# Patient Record
Sex: Female | Born: 1935 | Race: Black or African American | Hispanic: No | Smoking: Never smoker
Health system: Southern US, Community
[De-identification: ages and names within clinical notes are randomized; demographics above are authoritative.]

## PROBLEM LIST (undated history)

## (undated) DIAGNOSIS — G20 Parkinson's disease: Secondary | ICD-10-CM

## (undated) DIAGNOSIS — E039 Hypothyroidism, unspecified: Secondary | ICD-10-CM

## (undated) DIAGNOSIS — G20A1 Parkinson's disease without dyskinesia, without mention of fluctuations: Secondary | ICD-10-CM

## (undated) DIAGNOSIS — F0390 Unspecified dementia without behavioral disturbance: Secondary | ICD-10-CM

---

## 2011-04-10 ENCOUNTER — Emergency Department: Payer: Self-pay | Admitting: Internal Medicine

## 2012-09-06 ENCOUNTER — Other Ambulatory Visit: Payer: Self-pay

## 2012-09-07 LAB — URINALYSIS, COMPLETE
Bilirubin,UR: NEGATIVE
Blood: NEGATIVE
Glucose,UR: NEGATIVE mg/dL (ref 0–75)
Hyaline Cast: 1
Ph: 5 (ref 4.5–8.0)
Protein: 30
RBC,UR: 7 /HPF (ref 0–5)
Specific Gravity: 1.034 (ref 1.003–1.030)
Squamous Epithelial: <1
WBC UR: 180 /HPF (ref 0–5)

## 2012-10-03 ENCOUNTER — Ambulatory Visit: Payer: Self-pay | Admitting: Family

## 2012-10-17 ENCOUNTER — Emergency Department: Payer: Self-pay | Admitting: Emergency Medicine

## 2012-10-17 LAB — CBC
HGB: 9 g/dL — ABNORMAL LOW (ref 12.0–16.0)
MCH: 25 pg — ABNORMAL LOW (ref 26.0–34.0)
MCHC: 31.1 g/dL — ABNORMAL LOW (ref 32.0–36.0)
MCV: 80 fL (ref 80–100)
Platelet: 243 10*3/uL (ref 150–440)
RBC: 3.61 10*6/uL — ABNORMAL LOW (ref 3.80–5.20)
RDW: 16.2 % — ABNORMAL HIGH (ref 11.5–14.5)

## 2012-10-17 LAB — CK TOTAL AND CKMB (NOT AT ARMC): CK, Total: 121 U/L (ref 21–215)

## 2012-10-17 LAB — COMPREHENSIVE METABOLIC PANEL
Albumin: 3 g/dL — ABNORMAL LOW (ref 3.4–5.0)
Alkaline Phosphatase: 84 U/L (ref 50–136)
Anion Gap: 7 (ref 7–16)
Calcium, Total: 8.7 mg/dL (ref 8.5–10.1)
Co2: 28 mmol/L (ref 21–32)
EGFR (African American): 37 — ABNORMAL LOW
EGFR (Non-African Amer.): 32 — ABNORMAL LOW
Glucose: 116 mg/dL — ABNORMAL HIGH (ref 65–99)
Osmolality: 282 (ref 275–301)
Potassium: 4.2 mmol/L (ref 3.5–5.1)
SGOT(AST): 16 U/L (ref 15–37)
Total Protein: 7.5 g/dL (ref 6.4–8.2)

## 2012-10-17 LAB — URINALYSIS, COMPLETE
Bilirubin,UR: NEGATIVE
Nitrite: NEGATIVE
Ph: 8 (ref 4.5–8.0)
Protein: 30
RBC,UR: 2 /HPF (ref 0–5)
Squamous Epithelial: <1
WBC UR: 53 /HPF (ref 0–5)

## 2012-10-17 LAB — PROTIME-INR: Prothrombin Time: 13.8 secs (ref 11.5–14.7)

## 2012-10-17 LAB — TROPONIN I: Troponin-I: <0.02 ng/mL

## 2012-12-12 ENCOUNTER — Ambulatory Visit: Payer: Self-pay | Admitting: Family

## 2014-04-03 ENCOUNTER — Ambulatory Visit: Payer: Self-pay | Admitting: Family Medicine

## 2014-04-03 LAB — URINALYSIS, COMPLETE
BILIRUBIN, UR: NEGATIVE
GLUCOSE, UR: NEGATIVE mg/dL (ref 0–75)
Ketone: NEGATIVE
Nitrite: POSITIVE
Ph: 5 (ref 4.5–8.0)
Protein: NEGATIVE
RBC,UR: 2 /HPF (ref 0–5)
Specific Gravity: 1.017 (ref 1.003–1.030)
WBC UR: 60 /HPF (ref 0–5)

## 2014-04-05 LAB — URINE CULTURE

## 2018-06-29 ENCOUNTER — Other Ambulatory Visit: Payer: Self-pay

## 2018-06-29 ENCOUNTER — Inpatient Hospital Stay: Payer: Medicare (Managed Care)

## 2018-06-29 ENCOUNTER — Encounter: Payer: Self-pay | Admitting: Internal Medicine

## 2018-06-29 ENCOUNTER — Inpatient Hospital Stay
Admission: EM | Admit: 2018-06-29 | Discharge: 2018-07-03 | DRG: 377 | Disposition: A | Discharge status: Skilled Nursing Facility | Payer: Medicare (Managed Care) | Source: Skilled Nursing Facility | Attending: Internal Medicine | Admitting: Internal Medicine

## 2018-06-29 DIAGNOSIS — D696 Thrombocytopenia, unspecified: Secondary | ICD-10-CM | POA: Diagnosis present

## 2018-06-29 DIAGNOSIS — Z7902 Long term (current) use of antithrombotics/antiplatelets: Secondary | ICD-10-CM

## 2018-06-29 DIAGNOSIS — G20 Parkinson's disease: Secondary | ICD-10-CM | POA: Diagnosis present

## 2018-06-29 DIAGNOSIS — R4182 Altered mental status, unspecified: Secondary | ICD-10-CM | POA: Diagnosis not present

## 2018-06-29 DIAGNOSIS — R571 Hypovolemic shock: Secondary | ICD-10-CM | POA: Diagnosis present

## 2018-06-29 DIAGNOSIS — Z7401 Bed confinement status: Secondary | ICD-10-CM | POA: Diagnosis not present

## 2018-06-29 DIAGNOSIS — D62 Acute posthemorrhagic anemia: Secondary | ICD-10-CM | POA: Diagnosis present

## 2018-06-29 DIAGNOSIS — Z794 Long term (current) use of insulin: Secondary | ICD-10-CM

## 2018-06-29 DIAGNOSIS — E876 Hypokalemia: Secondary | ICD-10-CM | POA: Diagnosis present

## 2018-06-29 DIAGNOSIS — K625 Hemorrhage of anus and rectum: Secondary | ICD-10-CM

## 2018-06-29 DIAGNOSIS — Z79899 Other long term (current) drug therapy: Secondary | ICD-10-CM

## 2018-06-29 DIAGNOSIS — N281 Cyst of kidney, acquired: Secondary | ICD-10-CM | POA: Diagnosis present

## 2018-06-29 DIAGNOSIS — Z7982 Long term (current) use of aspirin: Secondary | ICD-10-CM | POA: Diagnosis not present

## 2018-06-29 DIAGNOSIS — I9589 Other hypotension: Secondary | ICD-10-CM | POA: Diagnosis not present

## 2018-06-29 DIAGNOSIS — F0280 Dementia in other diseases classified elsewhere without behavioral disturbance: Secondary | ICD-10-CM | POA: Diagnosis present

## 2018-06-29 DIAGNOSIS — K921 Melena: Principal | ICD-10-CM | POA: Diagnosis present

## 2018-06-29 DIAGNOSIS — I959 Hypotension, unspecified: Secondary | ICD-10-CM

## 2018-06-29 DIAGNOSIS — K922 Gastrointestinal hemorrhage, unspecified: Secondary | ICD-10-CM | POA: Diagnosis not present

## 2018-06-29 DIAGNOSIS — E039 Hypothyroidism, unspecified: Secondary | ICD-10-CM | POA: Diagnosis present

## 2018-06-29 HISTORY — DX: Parkinson's disease without dyskinesia, without mention of fluctuations: G20.A1

## 2018-06-29 HISTORY — DX: Unspecified dementia, unspecified severity, without behavioral disturbance, psychotic disturbance, mood disturbance, and anxiety: F03.90

## 2018-06-29 HISTORY — DX: Hypothyroidism, unspecified: E03.9

## 2018-06-29 HISTORY — DX: Parkinson's disease: G20

## 2018-06-29 LAB — CBC WITH DIFFERENTIAL/PLATELET
ABS IMMATURE GRANULOCYTES: 0.01 10*3/uL (ref 0.00–0.07)
Basophils Absolute: 0 10*3/uL (ref 0.0–0.1)
Basophils Relative: 0 %
Eosinophils Absolute: 0.1 10*3/uL (ref 0.0–0.5)
Eosinophils Relative: 2 %
HCT: 23.6 % — ABNORMAL LOW (ref 36.0–46.0)
Hemoglobin: 7 g/dL — ABNORMAL LOW (ref 12.0–15.0)
Immature Granulocytes: 0 %
Lymphocytes Relative: 22 %
Lymphs Abs: 1.3 10*3/uL (ref 0.7–4.0)
MCH: 26.9 pg (ref 26.0–34.0)
MCHC: 29.7 g/dL — ABNORMAL LOW (ref 30.0–36.0)
MCV: 90.8 fL (ref 80.0–100.0)
Monocytes Absolute: 0.4 10*3/uL (ref 0.1–1.0)
Monocytes Relative: 6 %
NRBC: 0 % (ref 0.0–0.2)
Neutro Abs: 4.2 10*3/uL (ref 1.7–7.7)
Neutrophils Relative %: 70 %
Platelets: 149 10*3/uL — ABNORMAL LOW (ref 150–400)
RBC: 2.6 MIL/uL — ABNORMAL LOW (ref 3.87–5.11)
RDW: 14.6 % (ref 11.5–15.5)
WBC: 6.1 10*3/uL (ref 4.0–10.5)

## 2018-06-29 LAB — COMPREHENSIVE METABOLIC PANEL
ALT: <5 U/L (ref 0–44)
AST: 17 U/L (ref 15–41)
Albumin: 2.6 g/dL — ABNORMAL LOW (ref 3.5–5.0)
Alkaline Phosphatase: 34 U/L — ABNORMAL LOW (ref 38–126)
Anion gap: 5 (ref 5–15)
BUN: 36 mg/dL — ABNORMAL HIGH (ref 8–23)
CHLORIDE: 112 mmol/L — AB (ref 98–111)
CO2: 23 mmol/L (ref 22–32)
Calcium: 8.2 mg/dL — ABNORMAL LOW (ref 8.9–10.3)
Creatinine, Ser: 0.67 mg/dL (ref 0.44–1.00)
GFR calc non Af Amer: >60 mL/min (ref >60)
Glucose, Bld: 120 mg/dL — ABNORMAL HIGH (ref 70–99)
Potassium: 5.1 mmol/L (ref 3.5–5.1)
SODIUM: 140 mmol/L (ref 135–145)
Total Bilirubin: 0.6 mg/dL (ref 0.3–1.2)
Total Protein: 5.2 g/dL — ABNORMAL LOW (ref 6.5–8.1)

## 2018-06-29 LAB — PROTIME-INR
INR: 1.34
PROTHROMBIN TIME: 16.4 s — AB (ref 11.4–15.2)

## 2018-06-29 LAB — GLUCOSE, CAPILLARY: Glucose-Capillary: 103 mg/dL — ABNORMAL HIGH (ref 70–99)

## 2018-06-29 LAB — TECHNOLOGIST SMEAR REVIEW

## 2018-06-29 LAB — PLATELET COUNT: Platelets: 114 10*3/uL — ABNORMAL LOW (ref 150–400)

## 2018-06-29 LAB — HEMOGLOBIN AND HEMATOCRIT, BLOOD
HCT: 32.2 % — ABNORMAL LOW (ref 36.0–46.0)
Hemoglobin: 10.3 g/dL — ABNORMAL LOW (ref 12.0–15.0)

## 2018-06-29 LAB — ABO/RH: ABO/RH(D): B POS

## 2018-06-29 LAB — APTT: aPTT: 29 seconds (ref 24–36)

## 2018-06-29 LAB — PREPARE RBC (CROSSMATCH)

## 2018-06-29 LAB — MRSA PCR SCREENING: MRSA by PCR: NEGATIVE

## 2018-06-29 LAB — FIBRINOGEN: Fibrinogen: 212 mg/dL (ref 210–475)

## 2018-06-29 LAB — CG4 I-STAT (LACTIC ACID): Lactic Acid, Venous: 2.36 mmol/L (ref 0.5–1.9)

## 2018-06-29 LAB — FIBRIN DERIVATIVES D-DIMER (ARMC ONLY): Fibrin derivatives D-dimer (ARMC): 3415.25 ng/mL (FEU) — ABNORMAL HIGH (ref 0.00–499.00)

## 2018-06-29 LAB — MASSIVE TRANSFUSION PROTOCOL ORDER (BLOOD BANK NOTIFICATION)

## 2018-06-29 MED ORDER — TRANEXAMIC ACID-NACL 1000-0.7 MG/100ML-% IV SOLN
1000.0000 mg | Freq: Once | INTRAVENOUS | Status: AC
  Administered 2018-06-29: 1000 mg via INTRAVENOUS
  Filled 2018-06-29: qty 100

## 2018-06-29 MED ORDER — PANTOPRAZOLE SODIUM 40 MG IV SOLR
40.0000 mg | Freq: Two times a day (BID) | INTRAVENOUS | Status: DC
  Administered 2018-07-03: 40 mg via INTRAVENOUS
  Filled 2018-06-29: qty 40

## 2018-06-29 MED ORDER — TECHNETIUM TC 99M-LABELED RED BLOOD CELLS IV KIT
23.7700 | PACK | Freq: Once | INTRAVENOUS | Status: AC | PRN
  Administered 2018-06-29: 23.77 via INTRAVENOUS

## 2018-06-29 MED ORDER — PANTOPRAZOLE SODIUM 40 MG IV SOLR
40.0000 mg | Freq: Once | INTRAVENOUS | Status: AC
  Administered 2018-06-29: 40 mg via INTRAVENOUS
  Filled 2018-06-29: qty 40

## 2018-06-29 MED ORDER — SODIUM CHLORIDE 0.9 % IV SOLN
8.0000 mg/h | INTRAVENOUS | Status: AC
  Administered 2018-06-29 – 2018-07-01 (×5): 8 mg/h via INTRAVENOUS
  Filled 2018-06-29 (×6): qty 80

## 2018-06-29 MED ORDER — SODIUM CHLORIDE 0.9 % IV BOLUS: 500.0000 mL | Freq: Once | INTRAVENOUS | Status: DC

## 2018-06-29 MED ORDER — ACETAMINOPHEN 325 MG PO TABS
650.0000 mg | ORAL_TABLET | Freq: Four times a day (QID) | ORAL | Status: DC | PRN
  Administered 2018-07-02: 650 mg via ORAL
  Filled 2018-06-29: qty 2

## 2018-06-29 MED ORDER — ONDANSETRON HCL 4 MG/2ML IJ SOLN: 4.0000 mg | Freq: Four times a day (QID) | INTRAMUSCULAR | Status: DC | PRN

## 2018-06-29 MED ORDER — SODIUM CHLORIDE 0.9% FLUSH
3.0000 mL | Freq: Two times a day (BID) | INTRAVENOUS | Status: DC
Start: 2018-06-29 — End: 2018-07-03
  Administered 2018-06-29 – 2018-07-03 (×8): 3 mL via INTRAVENOUS

## 2018-06-29 MED ORDER — SODIUM CHLORIDE 0.9% IV SOLUTION
Freq: Once | INTRAVENOUS | Status: DC
  Filled 2018-06-29: qty 250

## 2018-06-29 MED ORDER — ALBUTEROL SULFATE (2.5 MG/3ML) 0.083% IN NEBU: 2.5000 mg | INHALATION_SOLUTION | RESPIRATORY_TRACT | Status: DC | PRN

## 2018-06-29 MED ORDER — SODIUM CHLORIDE 0.9 % IV SOLN
80.0000 mg | Freq: Once | INTRAVENOUS | Status: AC
  Administered 2018-06-29: 80 mg via INTRAVENOUS
  Filled 2018-06-29: qty 80

## 2018-06-29 MED ORDER — NOREPINEPHRINE 16 MG/250ML-% IV SOLN
0.0000 ug/min | INTRAVENOUS | Status: DC
  Filled 2018-06-29: qty 250

## 2018-06-29 MED ORDER — ONDANSETRON HCL 4 MG PO TABS: 4.0000 mg | ORAL_TABLET | Freq: Four times a day (QID) | ORAL | Status: DC | PRN

## 2018-06-29 MED ORDER — ACETAMINOPHEN 650 MG RE SUPP: 650.0000 mg | Freq: Four times a day (QID) | RECTAL | Status: DC | PRN

## 2018-06-29 MED ORDER — SODIUM CHLORIDE 0.9 % IV SOLN
1000.0000 mL | Freq: Once | INTRAVENOUS | Status: AC
  Administered 2018-06-29: 1000 mL via INTRAVENOUS

## 2018-06-29 NOTE — H&P (Signed)
[facility] NAME: Frances Gardner    MR#:  [id number]  DATE OF BIRTH:  1926-06-27  DATE OF ADMISSION:  06/29/2018  PRIMARY CARE PHYSICIAN: Gareth Morgan, MD   REQUESTING/REFERRING PHYSICIAN: Dr. Archie Balboa  CHIEF COMPLAINT:   Chief Complaint  Patient presents with  . Altered Mental Status    HISTORY OF PRESENT ILLNESS:  Frances Gardner  is a 85 y.o. female with a known history of Parkinson's disease, hypothyroidism presents from local nursing home with severe rectal bleeding.  Patient was found to be hypotensive with systolic blood pressure in the 70s.  Massive blood transfusion protocol initiated and she received 4 units of packed RBC and is about to receive 1 unit of FFP and presently blood pressure has improved to 92/62.  Patient has a most form with full CODE STATUS.  Unable to contact any family.  Not on any anticoagulants. Patient is being admitted for severe GI bleed thought to be lower.  Bleeding scan pending. Patient is drowsy and confused and unable to give any history. Continues to have rectal bleeding at this time.  Blood on the patient's bed is being suctioned and has almost filled suction canister.  PAST MEDICAL HISTORY:   Past Medical History:  Diagnosis Date  . Dementia ([place])   . Hypothyroidism   . Parkinson's disease ([place])     PAST SURGICAL HISTORY:  none  SOCIAL HISTORY:   Social History   Tobacco Use  . Smoking status: Not on file  Substance Use Topics  . Alcohol use: Not on file    FAMILY HISTORY:  No family history on file. Unobtainable DRUG ALLERGIES:   Allergies  Allergen Reactions  . Penicillins   . Shellfish-Derived Products     REVIEW OF SYSTEMS:   Review of Systems  Unable to perform ROS: Mental status change    MEDICATIONS AT HOME:   Prior to Admission medications   Medication Sig Start Date End Date Taking? Authorizing Provider  acetaminophen (TYLENOL) 325 MG tablet Take 650 mg  by mouth every 6 (six) hours as needed.   Yes [provider]  acetaminophen (TYLENOL) 650 MG CR tablet Take 650 mg by mouth every 8 (eight) hours as needed for pain.   Yes [provider]  carbidopa-levodopa (SINEMET IR) 25-100 MG tablet Take 2 tablets by mouth 3 (three) times daily. 10/17/11  Yes [provider]  cholecalciferol (VITAMIN D) 25 MCG (1000 UT) tablet Take 1,000 Units by mouth daily.   Yes [provider]  guaiFENesin-dextromethorphan (ROBITUSSIN DM) 100-10 MG/5ML syrup Take 5 mLs by mouth every 4 (four) hours as needed for cough.   Yes [provider]  latanoprost (XALATAN) 0.005 % ophthalmic solution Place 1 drop into both eyes daily at 6 (six) AM. 05/30/11  Yes [provider]  mineral oil-hydrophilic petrolatum (AQUAPHOR) ointment Apply 1 application topically Nightly.   Yes [provider]  naloxone (NARCAN) nasal spray 4 mg/0.1 mL Place 1 spray into the nose.   Yes [provider]  senna (SENOKOT) 8.6 MG TABS tablet Take 8.6 mg by mouth 2 (two) times daily as needed for mild constipation.   Yes [provider]     VITAL SIGNS:  Blood pressure 104/90, pulse 84, temperature 97.7 F (36.5 C), temperature source Oral, resp. rate (!) 21, height 5\' 7"  (1.702 m), weight 95.3 kg, SpO2 100 %.  PHYSICAL EXAMINATION:  Physical Exam  GENERAL:  85 y.o.-year-old patient  lying in the bed . HEENT: Head atraumatic, normocephalic.  NECK:  LUNGS: CTA CARDIOVASCULAR: S1, S2 normal. No murmurs, rubs, or gallops.  ABDOMEN: Soft, nontender, nondistended. Bowel sounds present. EXTREMITIES : Edema  NEUROLOGIC: Not following instructions PSYCHIATRIC: The patient is drowzy SKIN: No obvious rash, lesion, or ulcer.   LABORATORY PANEL:   CBC Recent Labs  Lab 06/29/18 1450  WBC 6.1  HGB 7.0*  HCT 23.6*  PLT 149*    ------------------------------------------------------------------------------------------------------------------  Chemistries  Recent Labs  Lab 06/29/18 1450  NA 140  K 5.1  CL 112*  CO2 23  GLUCOSE 120*  BUN 36*  CREATININE 0.67  CALCIUM 8.2*  AST 17  ALT <5  ALKPHOS 34*  BILITOT 0.6   ------------------------------------------------------------------------------------------------------------------  Cardiac Enzymes No results for input(s): TROPONINI in the last 168 hours. ------------------------------------------------------------------------------------------------------------------  RADIOLOGY:  No results found.   IMPRESSION AND PLAN:   *GI bleed with acute blood loss anemia.  Continues to have severe bleeding. Status post 4 units packed RBC transfusion.  1 FFP pending.  At this time blood pressure is systolic 703.  Will get a stat bleeding scan.  IR has been contacted and are aware of the case.  IR needs to be called if bleeding scan positive.  No vomiting. We will bolus normal saline stat Calls have already been made to GI/surgery/vascular surgery. Patient is critically ill.  High risk for deterioration and death. Patient will be admitted to ICU.  Central access is being placed at this time.  Discussed with Dr. Jefferson Fuel in ICU.  I tried calling the contact numbers on her nursing home paperwork.  1 of the numbers is for [employer] agencies and the other number is out of service.  All the records are reviewed and case discussed with ED provider. Management plans discussed with the patient, family and they are in agreement.  CODE STATUS: FULL CODE  TOTAL TIME TAKING CARE OF THIS PATIENT: 80 minutes.   Leia Alf Maebel Marasco M.D on 06/29/2018 at 5:13 PM  Between 7am to 6pm - Pager - [phone]  After 6pm go to [email or web address] - password [email or web address]  Office  [phone]  CC: Primary care physician; Gareth Morgan, MD  Note:  This dictation was prepared with Dragon dictation along with smaller phrase technology. Any transcriptional errors that result from this process are unintentional.

## 2018-06-29 NOTE — ED Triage Notes (Signed)
Pt to ER via EMS from [facility] with reports of diarrhea and altered mental status. Pt is normally interactive, on arrival she is alert, but does not respond to to questions or commands.

## 2018-06-29 NOTE — Progress Notes (Signed)
Patient is confused and pulling at central line, hand mitts already in place. Telesitter order entered for patient safety.

## 2018-06-29 NOTE — Consult Note (Addendum)
PULMONARY / CRITICAL CARE MEDICINE  Name: Frances Gardner MRN: [id number] DOB: 1926-09-12    LOS: 1  Referring Provider: Dr. Darvin Neighbours Reason for Referral: Acute GI bleed Brief patient description: 85 year old nursing home resident who presented to the ED following several episodes of bright red blood per rectum.  Had a positive bleeding scan and received 4 units of packed red blood cells and 1 units of fresh frozen plasma.  Status post angiogram without any identified source of bleeding.  HPI: This is an 85 year old female with a medical history as indicated below who presented to the ED from [facility] with several episodes of rectal bleeding.  At the ED, patient continued to have bloody stools.  She was given 4 units of packed red blood cells and 1 unit of FFP.  Her blood pressure was low with systolics in the 23R.  Blood pressure improved with transfusion.  She was taken for a bleeding scan that was positive.  IR was consulted and she was taken for visceral angiogram.  No source of bleeding identified.  She is now status post angiogram and her blood pressure remained stable.  Repeat labs show a hemoglobin of 9.6 and a hematocrit of 29.8.  Her platelets are 105 and her WBC is 12.0.  PT/INR 16.4/1.3.  She remains confused but is able to follow basic commands.  She denies abdominal pain, nausea and vomiting.  No further rectal bleeding noted.  Postprocedure patient patient passed out small amount of dark-colored blood. Of note, patient is not on any anticoagulants. She is currently on a Protonix infusion  Past Medical History:  Diagnosis Date  . Dementia ([place])   . Hypothyroidism   . Parkinson's disease ([place])    History reviewed. No pertinent surgical history. Prior to Admission medications   Medication Sig Start Date End Date Taking? Authorizing Provider  amLODipine (NORVASC) 5 MG tablet Take 5 mg by mouth daily.   Yes [provider]  clopidogrel (PLAVIX) 75 MG tablet Take  75 mg by mouth daily.   Yes [provider]  donepezil (ARICEPT) 5 MG tablet Take 1 tablet (5 mg total) by mouth at bedtime. 02/17/18 03/29/18 Yes Sowles, Drue Stager, MD  empagliflozin (JARDIANCE) 25 MG TABS tablet Take 25 mg by mouth daily.   Yes [provider]  glycopyrrolate (ROBINUL) 1 MG tablet Take 1 mg by mouth 2 (two) times daily.   Yes [provider]  insulin aspart (NOVOLOG FLEXPEN) 100 UNIT/ML FlexPen Inject 12 Units into the skin 2 (two) times daily.   Yes [provider]  insulin aspart (NOVOLOG) 100 UNIT/ML FlexPen Inject 18 Units into the skin daily. At 1700   Yes [provider]  Insulin Degludec-Liraglutide (XULTOPHY) 100-3.6 UNIT-MG/ML SOPN Inject 50 Units into the skin daily.   Yes [provider]  levETIRAcetam (KEPPRA) 500 MG tablet Take 500 mg by mouth 2 (two) times daily.   Yes [provider]  lipase/protease/amylase (CREON) 12000 units CPEP capsule Take 6,000 Units by mouth 3 (three) times daily before meals.   Yes [provider]  lipase/protease/amylase (CREON) 12000 units CPEP capsule Take 3,000 Units by mouth at bedtime. With snack   Yes [provider]  lisinopril (PRINIVIL,ZESTRIL) 5 MG tablet Take 5 mg by mouth daily.   Yes [provider]  metoprolol succinate (TOPROL-XL) 25 MG 24 hr tablet Take 1 tablet (25 mg total) by mouth daily. 02/17/18  Yes Sowles, Drue Stager, MD  rosuvastatin (CRESTOR) 40 MG tablet Take 1  tablet (40 mg total) by mouth daily. 02/17/18 03/29/18 Yes Steele Sizer, MD  aspirin EC 81 MG tablet Take 81 mg by mouth daily.    [provider]  famotidine (PEPCID) 20 MG tablet Take 1 tablet (20 mg total) by mouth 2 (two) times daily. 02/17/18 03/19/18  Steele Sizer, MD  gabapentin (NEURONTIN) 300 MG capsule Take 1 capsule (300 mg total) by mouth 2 (two) times daily. 02/17/18 03/19/18  Steele Sizer, MD  insulin glargine (LANTUS) 100 UNIT/ML injection Inject 0.1  mLs (10 Units total) into the skin daily. 02/17/18 03/19/18  Steele Sizer, MD  lacosamide 100 MG TABS Take 1 tablet (100 mg total) by mouth 2 (two) times daily. Patient not taking: Reported on 03/29/2018 07/05/17   Fritzi Mandes, MD  promethazine (PHENERGAN) 12.5 MG tablet Take 1 tablet (12.5 mg total) by mouth every 6 (six) hours as needed for nausea or vomiting. Patient not taking: Reported on 03/29/2018 04/23/17   Stark Klein, MD  sertraline (ZOLOFT) 25 MG tablet Take 1 tablet (25 mg total) by mouth daily. Patient not taking: Reported on 03/29/2018 02/17/18   Steele Sizer, MD   Allergies Allergies  Allergen Reactions  . Penicillins   . Shellfish-Derived Products     Family History History reviewed. No pertinent family history. Social History  has an unknown smoking status. She has never used smokeless tobacco. No history on file for alcohol and drug.  Review Of Systems:   Constitutional: Negative for fever and chills.  HENT: Negative for congestion and rhinorrhea.  Eyes: Negative for redness and visual disturbance.  Respiratory: Negative for shortness of breath and wheezing.  Cardiovascular: Negative for chest pain and palpitations.  Gastrointestinal: Positive for bloody stools Genitourinary: Negative for dysuria and urgency.  Endocrine: Denies polyuria, polyphagia and heat intolerance Musculoskeletal: Negative for myalgias and arthralgias.  Skin: Negative for pallor and wound.  Neurological: Negative for dizziness and headaches   VITAL SIGNS: BP 107/61   Pulse 88   Temp 98.4 F (36.9 C) (Oral)   Resp 20   Ht 5\' 8"  (1.727 m)   Wt 77.4 kg   SpO2 100%   BMI 25.95 kg/m   HEMODYNAMICS:    VENTILATOR SETTINGS:    INTAKE / OUTPUT: I/O last 3 completed shifts: In: 655.9 [I.V.:500; IV Piggyback:155.9] Out: 700 [Stool:700]  PHYSICAL EXAMINATION: General: Appears chronically ill HEENT: PERRLA, trachea midline, no JVD Neuro: Awake, alert, confused, follows basic  commands, speech is normal Cardiovascular: Apical pulse regular, S1-S2, no murmur regurg or gallop, +2 pulses bilaterally, trace edema Lungs: Clear to auscultation bilaterally, diminished in the bases Abdomen: Nondistended, normal bowel sounds in all 4 quadrants, palpation reveals no organomegaly Musculoskeletal: No joint deformities or swelling Skin: Pale, warm and dry  LABS:  BMET Recent Labs  Lab 06/29/18 1450  NA 140  K 5.1  CL 112*  CO2 23  BUN 36*  CREATININE 0.67  GLUCOSE 120*    Electrolytes Recent Labs  Lab 06/29/18 1450 06/29/18 2344  CALCIUM 8.2*  --   MG  --  1.9  PHOS  --  3.9    CBC Recent Labs  Lab 06/29/18 1450 06/29/18 1634 06/29/18 1847 06/29/18 2344  WBC 6.1  --   --  12.0*  HGB 7.0*  --  10.3* 9.6*  HCT 23.6*  --  32.2* 29.8*  PLT 149* 114*  --  105*    Coag's Recent Labs  Lab 06/29/18 1634 06/29/18 2344  APTT 29  --  INR 1.34 1.34    Sepsis Markers Recent Labs  Lab 06/29/18 1507  LATICACIDVEN 2.36*    ABG No results for input(s): PHART, PCO2ART, PO2ART in the last 168 hours.  Liver Enzymes Recent Labs  Lab 06/29/18 1450  AST 17  ALT <5  ALKPHOS 34*  BILITOT 0.6  ALBUMIN 2.6*    Cardiac Enzymes No results for input(s): TROPONINI, PROBNP in the last 168 hours.  Glucose Recent Labs  Lab 06/29/18 1846  GLUCAP 103*    Imaging Nm Gi Blood Loss  Result Date: 06/29/2018 CLINICAL DATA:  GI bleed beginning yesterday EXAM: NUCLEAR MEDICINE GASTROINTESTINAL BLEEDING SCAN TECHNIQUE: Sequential abdominal images were obtained following intravenous administration of Tc-23m labeled red blood cells. RADIOPHARMACEUTICALS:  23.77 mCi Tc-16m pertechnetate in-vitro labeled red cells. COMPARISON:  None FINDINGS: Normal blood pool distribution of tracer. Beginning at 15 minutes, abnormal gastrointestinal localization of tracer is identified beginning in the far LEFT upper quadrant. Tracer than progresses caudally in the lateral LEFT  mid abdomen. Findings are most consistent with acute gastrointestinal bleeding at the region of the splenic flexure of the colon. IMPRESSION: Positive GI bleeding scan for presence of active gastrointestinal bleeding at the splenic flexure of the colon. Electronically Signed   By: Lavonia Dana M.D.   On: 06/29/2018 22:45   Dg Chest Portable 1 View  Result Date: 06/29/2018 CLINICAL DATA:  Central line placement. EXAM: PORTABLE CHEST 1 VIEW COMPARISON:  None. FINDINGS: Central venous catheter via LEFT internal jugular venous approach with distal tip projecting in proximal superior vena cava. Patient rotated to the RIGHT. Mild cardiomegaly. No pleural effusion or focal consolidation. No pneumothorax. RIGHT mid lung zone granuloma versus projectional artifact. Skin fold projected chest. Surgical clips in the included right abdomen compatible with cholecystectomy. IMPRESSION: 1. LEFT internal jugular central venous catheter distal tip projects in proximal superior vena cava. No pneumothorax. 2. Mild cardiomegaly, no acute pulmonary process. Electronically Signed   By: Elon Alas M.D.   On: 06/29/2018 18:11    STUDIES:  None  CULTURES: None  ANTIBIOTICS: None  SIGNIFICANT EVENTS: 06/29/2017: Admitted  LINES/TUBES: PIV's   ASSESSMENT  Acute lower GI bleed-status post positive bleeding scan but negative angiogram with no identified source to embolize Acute blood loss anemia Hypovolemic shock Thrombocytopenia  PLAN Hemodynamic monitoring per ICU protocol Treatment plan per GI and IR recommendations Trend hemoglobin and hematocrit and transfuse for hemoglobin less than 7 Keep n.p.o. and initiate clear liquids if no further bleeding Monitor for worsening thrombocytopenia IV fluids and pressors to maintain mean arterial blood pressure greater than 65 Continue Protonix infusion  Best Practice: Code Status: Full code.  Patient has a court assigned guardian Diet: N.p.o. and start clear  liquids in the morning if no further bleeding GI prophylaxis: Already on a Protonix infusion VTE prophylaxis: Pharmacologic VTE prophylaxis contraindicated due to bleeding  FAMILY  - Updates: Multiple calls placed to multiple family members and and guardian. Grandson called back as well as a representative from social services department.  Both updated on patient's status.  Tiaja Hagan S[facility] ANP-BC Pulmonary and Critical Care Medicine [facility] Pager [phone] or [phone]  NB: This document was prepared using Dragon voice recognition software and may include unintentional dictation errors.    06/30/2018, 4:36 AM

## 2018-06-29 NOTE — Consult Note (Addendum)
Chief Complaint: Patient was seen in consultation today for GI bleed  Referring Physician(s): Jodene Nam, NP  Patient Status: [place] - In-pt  History of Present Illness: Frances Gardner is a 85 y.o. female presenting with profuse lower GI bleed today and has now required 4U RBC and 1U FFP transfusion. NM bleeding scan recently completed tonight is positive for bleeding in region of splenic flexure of colon with transit in the colon.  Hgb did respond to transfusion, but still actively bleeding, including bloody BM during bleeding scan. Soft BP's despite blood products and IV fluids. Currently not on pressors or intubated. Patient is a full code.  Patient resident of [facility] and demented. Unable to contact family. In process of contacting DSS and Adult Protective Services. ICU team willing to give emergent consent given urgency of procedure.  Past Medical History:  Diagnosis Date  . Dementia ([place])   . Hypothyroidism   . Parkinson's disease ([place])      Allergies: Penicillins and Shellfish-derived products  Medications: Prior to Admission medications   Medication Sig Start Date End Date Taking? Authorizing Provider  acetaminophen (TYLENOL) 325 MG tablet Take 650 mg by mouth every 6 (six) hours as needed.   Yes [provider]  acetaminophen (TYLENOL) 650 MG CR tablet Take 650 mg by mouth every 8 (eight) hours as needed for pain.   Yes [provider]  carbidopa-levodopa (SINEMET IR) 25-100 MG tablet Take 2 tablets by mouth 3 (three) times daily. 10/17/11  Yes [provider]  cholecalciferol (VITAMIN D) 25 MCG (1000 UT) tablet Take 1,000 Units by mouth daily.   Yes [provider]  guaiFENesin-dextromethorphan (ROBITUSSIN DM) 100-10 MG/5ML syrup Take 5 mLs by mouth every 4 (four) hours as needed for cough.   Yes [provider]  latanoprost (XALATAN) 0.005 % ophthalmic solution Place 1 drop into both eyes daily at 6 (six) AM. 05/30/11   Yes [provider]  mineral oil-hydrophilic petrolatum (AQUAPHOR) ointment Apply 1 application topically Nightly.   Yes [provider]  naloxone (NARCAN) nasal spray 4 mg/0.1 mL Place 1 spray into the nose.   Yes [provider]  senna (SENOKOT) 8.6 MG TABS tablet Take 8.6 mg by mouth 2 (two) times daily as needed for mild constipation.   Yes [provider]     No family history on file.  Social History   Socioeconomic History  . Marital status: Unknown    Spouse name: Not on file  . Number of children: Not on file  . Years of education: Not on file  . Highest education level: Not on file  Occupational History  . Not on file  Social Needs  . Financial resource strain: Not on file  . Food insecurity:    Worry: Not on file    Inability: Not on file  . Transportation needs:    Medical: Not on file    Non-medical: Not on file  Tobacco Use  . Smoking status: Not on file  Substance and Sexual Activity  . Alcohol use: Not on file  . Drug use: Not on file  . Sexual activity: Not on file  Lifestyle  . Physical activity:    Days per week: Not on file    Minutes per session: Not on file  . Stress: Not on file  Relationships  . Social connections:    Talks on phone: Not on file    Gets together: Not on file    Attends religious service:  Not on file    Active member of club or organization: Not on file    Attends meetings of clubs or organizations: Not on file    Relationship status: Not on file  Other Topics Concern  . Not on file  Social History Narrative  . Not on file     Review of Systems: A 12 point ROS discussed and pertinent positives are indicated in the HPI above.  All other systems are negative.  Review of Systems  Respiratory: Negative.   Gastrointestinal: Positive for blood in stool. Negative for abdominal distention, abdominal pain, nausea and vomiting.  Genitourinary: Negative.     Vital Signs: BP (!) 88/75    Pulse 94   Temp (!) 97.5 F (36.4 C) (Axillary)   Resp 17   Ht 5\' 8"  (1.727 m)   Wt 77.4 kg   SpO2 99%   BMI 25.95 kg/m   Physical Exam Vitals signs reviewed.  Constitutional:      General: She is not in acute distress. Cardiovascular:     Pulses: Normal pulses.  Abdominal:     General: Abdomen is flat. There is no distension.     Tenderness: There is no abdominal tenderness. There is no guarding or rebound.  Skin:    General: Skin is warm and dry.     Imaging: Nm Gi Blood Loss  Result Date: 06/29/2018 CLINICAL DATA:  GI bleed beginning yesterday EXAM: NUCLEAR MEDICINE GASTROINTESTINAL BLEEDING SCAN TECHNIQUE: Sequential abdominal images were obtained following intravenous administration of Tc-23m labeled red blood cells. RADIOPHARMACEUTICALS:  23.77 mCi Tc-31m pertechnetate in-vitro labeled red cells. COMPARISON:  None FINDINGS: Normal blood pool distribution of tracer. Beginning at 15 minutes, abnormal gastrointestinal localization of tracer is identified beginning in the far LEFT upper quadrant. Tracer than progresses caudally in the lateral LEFT mid abdomen. Findings are most consistent with acute gastrointestinal bleeding at the region of the splenic flexure of the colon. IMPRESSION: Positive GI bleeding scan for presence of active gastrointestinal bleeding at the splenic flexure of the colon. Electronically Signed   By: Lavonia Dana M.D.   On: 06/29/2018 22:45   Dg Chest Portable 1 View  Result Date: 06/29/2018 CLINICAL DATA:  Central line placement. EXAM: PORTABLE CHEST 1 VIEW COMPARISON:  None. FINDINGS: Central venous catheter via LEFT internal jugular venous approach with distal tip projecting in proximal superior vena cava. Patient rotated to the RIGHT. Mild cardiomegaly. No pleural effusion or focal consolidation. No pneumothorax. RIGHT mid lung zone granuloma versus projectional artifact. Skin fold projected chest. Surgical clips in the included right abdomen compatible with  cholecystectomy. IMPRESSION: 1. LEFT internal jugular central venous catheter distal tip projects in proximal superior vena cava. No pneumothorax. 2. Mild cardiomegaly, no acute pulmonary process. Electronically Signed   By: Elon Alas M.D.   On: 06/29/2018 18:11    Labs:  CBC: Recent Labs    06/29/18 1450 06/29/18 1634 06/29/18 1847  WBC 6.1  --   --   HGB 7.0*  --  10.3*  HCT 23.6*  --  32.2*  PLT 149* 114*  --     COAGS: Recent Labs    06/29/18 1634  INR 1.34  APTT 29    BMP: Recent Labs    06/29/18 1450  NA 140  K 5.1  CL 112*  CO2 23  GLUCOSE 120*  BUN 36*  CALCIUM 8.2*  CREATININE 0.67  GFRNONAA >60  GFRAA >60    LIVER FUNCTION TESTS: Recent Labs  06/29/18 1450  BILITOT 0.6  AST 17  ALT <5  ALKPHOS 34*  PROT 5.2*  ALBUMIN 2.6*    Assessment and Plan:  Given positive bleeding scan localizing near region of splenic flexure and ongoing lower GI bleeding, emergent arteriography with possible embolization is indicated. Will proceed with procedure. Awaiting phone call from Adult Protective Services. If no one available, will proceed with emergent consent from ICU caregivers given emergent nature of procedure.  Thank you for this interesting consult.  I greatly enjoyed meeting Berdine Watkin and look forward to participating in their care.  A copy of this report was sent to the requesting provider on this date.  Electronically Signed: Azzie Roup, MD 06/29/2018, 11:48 PM   I spent a total of 20 Minutes  in face to face in clinical consultation, greater than 50% of which was counseling/coordinating care for lower GI bleed.

## 2018-06-29 NOTE — ED Provider Notes (Signed)
[facility] Emergency Department Provider Note  ____________________________________________   I have reviewed the triage vital signs and the nursing notes.   HISTORY  Chief Complaint GI bleed, hypotension  History limited by and level 5 caveat due to: AMS, some history obtained from PCP, Dr. Meredith Staggers   HPI Frances Gardner is a 85 y.o. female who presents to the emergency department today because of concerns for GI bleed and hypotension.  Patient unfortunately is unable to give any significant history.  I did call patient's primary care physician.  It sounds like the patient had an episode of some GI bleed yesterday however it was quite minimal.  She then started having more significant bleeding today.  She was noted to be hypotensive.  The patient apparently has never had a GI bleed in the past.  Primary care physician states she is a full code.  Also stated that she is not on any anticoagulants.  Per medical record review patient has a history of parkinsons disease  PMH: Parkinsons  There are no active problems to display for this patient.   Prior to Admission medications   Not on File    Allergies Patient has no allergy information on record.  No family history on file.  Social History Social History   Tobacco Use  . Smoking status: Not on file  Substance Use Topics  . Alcohol use: Not on file  . Drug use: Not on file    Review of Systems Unable to obtain secondary to AMS  ____________________________________________   PHYSICAL EXAM:  VITAL SIGNS: ED Triage Vitals  Enc Vitals Group     BP 06/29/18 1444 (!) 91/59     Pulse Rate 06/29/18 1444 79     Resp 06/29/18 1444 (!) 22     Temp 06/29/18 1444 98.3 F (36.8 C)     Temp Source 06/29/18 1444 Oral     SpO2 06/29/18 1444 100 %     Weight 06/29/18 1445 210 lb (95.3 kg)     Height 06/29/18 1445 5\' 7"  (1.702 m)   Constitutional: Awake and alert. Non verbal.  Eyes: Conjunctivae are  normal.  ENT      Head: Normocephalic and atraumatic.      Nose: No congestion/rhinnorhea.      Mouth/Throat: Mucous membranes are moist.      Neck: No stridor. Hematological/Lymphatic/Immunilogical: No cervical lymphadenopathy. Cardiovascular: Normal rate, regular rhythm.   Respiratory: Tachypnea. Clear breath sounds.  Gastrointestinal: Large amount of red blood and stool Genitourinary: Deferred Musculoskeletal: Normal range of motion in all extremities. No lower extremity edema. Neurologic:  Awake and alert. Non verbal.  Skin:  Skin is pale  ____________________________________________    LABS (pertinent positives/negatives)  CBC wbc 6.1, hgb 7.0, plt 149 CMP na 140, k 5.1, glu 120, cr 0.67 D-dimer 3415.25 INR 1.34 PTT 29 CMP na 140, k 5.1, glu 120, cr 0.67 CBC wbc 6.1, hgb 7.0, plt 149 ____________________________________________   EKG  None  ____________________________________________    RADIOLOGY  CXR Central line in appropriate position  ____________________________________________   PROCEDURES  Procedures  CRITICAL CARE Performed by: Nance Pear   Total critical care time: 75 minutes  Critical care time was exclusive of separately billable procedures and treating other patients.  Critical care was necessary to treat or prevent imminent or life-threatening deterioration.  Critical care was time spent personally by me on the following activities: development of treatment plan with patient and/or surrogate as well as nursing, discussions with  consultants, evaluation of patient's response to treatment, examination of patient, obtaining history from patient or surrogate, ordering and performing treatments and interventions, ordering and review of laboratory studies, ordering and review of radiographic studies, pulse oximetry and re-evaluation of patient's condition.  CENTRAL LINE Performed by: Nance Pear Consent: The procedure was performed  in an emergent situation. Required items: required blood products, implants, devices, and special equipment available Patient identity confirmed: arm band and provided demographic data Time out: Immediately prior to procedure a "time out" was called to verify the correct patient, procedure, equipment, support staff and site/side marked as required. Indications: vascular access Anesthesia: local infiltration Local anesthetic: lidocaine 1% with epinephrine Anesthetic total: 2 ml Patient sedated: no Preparation: skin prepped with 2% chlorhexidine Skin prep agent dried: skin prep agent completely dried prior to procedure Sterile barriers: all five maximum sterile barriers used - cap, mask, sterile gown, sterile gloves, and large sterile sheet Hand hygiene: hand hygiene performed prior to central venous catheter insertion  Location details: left IJ  Catheter type: triple lumen Catheter size: 8 Fr Pre-procedure: landmarks identified Ultrasound guidance: yes Successful placement: yes Post-procedure: line sutured and dressing applied Assessment: blood return through all parts, free fluid flow, placement verified by x-ray and no pneumothorax on x-ray Patient tolerance: Patient tolerated the procedure well with no immediate complications.   ____________________________________________   INITIAL IMPRESSION / ASSESSMENT AND PLAN / ED COURSE  Pertinent labs & imaging results that were available during my care of the patient were reviewed by me and considered in my medical decision making (see chart for details).   Patient presented to the emergency department today from living facility because of concerns for GI bleeding and hypotension.  Patient apparently had a small amount of GI bleeding yesterday and then today.  On exam here patient appears somewhat pale.  She is awake and alert although nonverbal at this time.  Patient was found to be significantly hypotensive.  Patient was also found to  have had a large bloody bowel movement.  Given concern for large bloody bowel movement and hypotension patient was ordered emergency release blood.  Hemoglobin 7 on initial testing here.  Patient continued to have further episodes of GI bleeding here.  Did order massive transfusion protocol.  Additionally ordered both TXA and Protonix.  Patient's bleeding did seem to slow down somewhat after the TXA.  I did initially speak to Dr. Shelia Media with vascular who stated he did not perform embolizations. Then spoke to Dr. Kathlene Cote with interventional radiology who stated he did feel it would be of little benefit to perform angio prior to bleeding scan and recommended stabilization for bleeding scan. Then I spoke to Dr. Bonna Gains with GI who stated that colonoscopy in setting of large volume of bleeding would be of little benefit. Finally spoke to Dr. Celine Ahr with surgery who stated surgery at this time is of little value in this setting, recommended embolization and angio. I had already contacted IR about this possibility. Did discuss with hospitalists who will plan on admission. Central line was placed for better central access. Patient will be admitted to ICU.  ____________________________________________   FINAL CLINICAL IMPRESSION(S) / ED DIAGNOSES  Final diagnoses:  Altered mental status, unspecified altered mental status type  Gastrointestinal hemorrhage, unspecified gastrointestinal hemorrhage type  Acute blood loss anemia  Hypotension, unspecified hypotension type     Note: This dictation was prepared with Dragon dictation. Any transcriptional errors that result from this process are unintentional     Archie Balboa,  Carter Kitten, MD 06/29/18 2057

## 2018-06-29 NOTE — ED Notes (Signed)
Per Dr. Darvin Neighbours at pt bedside, pt to receive 4 units PRBC and 1 unit FFP and then "hold for now" in regards to MTP protocol. Anderson Malta, RN at bedside acknowledges as well.

## 2018-06-29 NOTE — ED Notes (Signed)
FFP delivered at bedside to [facility] [place]

## 2018-06-29 NOTE — Plan of Care (Signed)
Contacted by ED physician regarding 85 y/o F with acute GIB, hemodynamically unstable, undergoing massive transfusion protocol. GI stated that they would likely not be able to visualize anything due to rate of blood loss.  Without a source identified to treat, I would not be able to offer her surgical intervention, either.  Recommend IR angiography for both diagnostic and potential therapeutic intervention.

## 2018-06-30 ENCOUNTER — Encounter: Payer: Self-pay | Admitting: Adult Health

## 2018-06-30 ENCOUNTER — Inpatient Hospital Stay: Payer: Medicare (Managed Care)

## 2018-06-30 DIAGNOSIS — K625 Hemorrhage of anus and rectum: Secondary | ICD-10-CM

## 2018-06-30 DIAGNOSIS — I959 Hypotension, unspecified: Secondary | ICD-10-CM

## 2018-06-30 DIAGNOSIS — D62 Acute posthemorrhagic anemia: Secondary | ICD-10-CM

## 2018-06-30 DIAGNOSIS — K921 Melena: Principal | ICD-10-CM

## 2018-06-30 DIAGNOSIS — R4182 Altered mental status, unspecified: Secondary | ICD-10-CM

## 2018-06-30 HISTORY — PX: IR ANGIOGRAM VISCERAL SELECTIVE: IMG657

## 2018-06-30 LAB — CBC
HCT: 29.8 % — ABNORMAL LOW (ref 36.0–46.0)
HEMATOCRIT: 26.1 % — AB (ref 36.0–46.0)
HEMATOCRIT: 22.4 % — AB (ref 36.0–46.0)
HEMOGLOBIN: 8.5 g/dL — AB (ref 12.0–15.0)
Hemoglobin: 7.3 g/dL — ABNORMAL LOW (ref 12.0–15.0)
Hemoglobin: 9.6 g/dL — ABNORMAL LOW (ref 12.0–15.0)
MCH: 27.2 pg (ref 26.0–34.0)
MCH: 27.6 pg (ref 26.0–34.0)
MCH: 27.7 pg (ref 26.0–34.0)
MCHC: 32.2 g/dL (ref 30.0–36.0)
MCHC: 32.6 g/dL (ref 30.0–36.0)
MCHC: 32.6 g/dL (ref 30.0–36.0)
MCV: 83.7 fL (ref 80.0–100.0)
MCV: 84.8 fL (ref 80.0–100.0)
MCV: 85.6 fL (ref 80.0–100.0)
Platelets: 105 10*3/uL — ABNORMAL LOW (ref 150–400)
Platelets: 108 10*3/uL — ABNORMAL LOW (ref 150–400)
Platelets: 99 10*3/uL — ABNORMAL LOW (ref 150–400)
RBC: 3.12 MIL/uL — ABNORMAL LOW (ref 3.87–5.11)
RBC: 3.48 MIL/uL — AB (ref 3.87–5.11)
RBC: 2.64 MIL/uL — ABNORMAL LOW (ref 3.87–5.11)
RDW: 15.7 % — ABNORMAL HIGH (ref 11.5–15.5)
RDW: 16 % — ABNORMAL HIGH (ref 11.5–15.5)
RDW: 16.6 % — ABNORMAL HIGH (ref 11.5–15.5)
WBC: 11.5 10*3/uL — ABNORMAL HIGH (ref 4.0–10.5)
WBC: 12 10*3/uL — ABNORMAL HIGH (ref 4.0–10.5)
WBC: 12 10*3/uL — ABNORMAL HIGH (ref 4.0–10.5)
nRBC: 0 % (ref 0.0–0.2)
nRBC: 0 % (ref 0.0–0.2)
nRBC: 0 % (ref 0.0–0.2)

## 2018-06-30 LAB — BASIC METABOLIC PANEL
Anion gap: 5 (ref 5–15)
BUN: 33 mg/dL — ABNORMAL HIGH (ref 8–23)
CO2: 20 mmol/L — ABNORMAL LOW (ref 22–32)
Calcium: 7.7 mg/dL — ABNORMAL LOW (ref 8.9–10.3)
Chloride: 118 mmol/L — ABNORMAL HIGH (ref 98–111)
Creatinine, Ser: 0.62 mg/dL (ref 0.44–1.00)
GFR calc Af Amer: >60 mL/min (ref >60)
GFR calc non Af Amer: >60 mL/min (ref >60)
GLUCOSE: 130 mg/dL — AB (ref 70–99)
POTASSIUM: 3.6 mmol/L (ref 3.5–5.1)
Sodium: 143 mmol/L (ref 135–145)

## 2018-06-30 LAB — BPAM FFP
Blood Product Expiration Date: 202001102359
Blood Product Expiration Date: 202001102359
Blood Product Expiration Date: 202001102359
Blood Product Expiration Date: 202001102359
ISSUE DATE / TIME: 202001051706
Unit Type and Rh: 7300
Unit Type and Rh: 7300
Unit Type and Rh: 7300
Unit Type and Rh: 7300

## 2018-06-30 LAB — PREPARE FRESH FROZEN PLASMA
Unit division: 0
Unit division: 0

## 2018-06-30 LAB — PHOSPHORUS
Phosphorus: 3.4 mg/dL (ref 2.5–4.6)
Phosphorus: 3.9 mg/dL (ref 2.5–4.6)

## 2018-06-30 LAB — MAGNESIUM
Magnesium: 1.8 mg/dL (ref 1.7–2.4)
Magnesium: 1.9 mg/dL (ref 1.7–2.4)

## 2018-06-30 LAB — PROTIME-INR
INR: 1.34
Prothrombin Time: 16.4 seconds — ABNORMAL HIGH (ref 11.4–15.2)

## 2018-06-30 MED ORDER — IOPAMIDOL (ISOVUE-300) INJECTION 61%
200.0000 mL | Freq: Once | INTRAVENOUS | Status: AC | PRN
  Administered 2018-06-30: 80 mL via INTRA_ARTERIAL

## 2018-06-30 MED ORDER — MIDAZOLAM HCL 5 MG/5ML IJ SOLN
INTRAMUSCULAR | Status: AC | PRN
  Administered 2018-06-30: 0.5 mg via INTRAVENOUS

## 2018-06-30 MED ORDER — FENTANYL CITRATE (PF) 100 MCG/2ML IJ SOLN
INTRAMUSCULAR | Status: AC | PRN
  Administered 2018-06-30: 25 ug via INTRAVENOUS

## 2018-06-30 MED ORDER — FENTANYL CITRATE (PF) 100 MCG/2ML IJ SOLN
INTRAMUSCULAR | Status: AC
  Filled 2018-06-30: qty 2

## 2018-06-30 MED ORDER — MIDAZOLAM HCL 5 MG/5ML IJ SOLN
INTRAMUSCULAR | Status: AC
  Filled 2018-06-30: qty 5

## 2018-06-30 MED ORDER — ORAL CARE MOUTH RINSE
15.0000 mL | Freq: Two times a day (BID) | OROMUCOSAL | Status: DC
  Administered 2018-06-30 – 2018-07-02 (×7): 15 mL via OROMUCOSAL

## 2018-06-30 MED ORDER — SODIUM CHLORIDE 0.9 % IV BOLUS: 1000.0000 mL | Freq: Once | INTRAVENOUS | Status: DC

## 2018-06-30 NOTE — Progress Notes (Signed)
[facility] NAME: Frances Gardner    MR#:  [id number]  DATE OF BIRTH:  07-23-1926  SUBJECTIVE:  CHIEF COMPLAINT:   Chief Complaint  Patient presents with  . Altered Mental Status   No new complaint this morning.  No further bleeding reported.  Sitting up in bed.  REVIEW OF SYSTEMS:  Review of Systems  Constitutional: Negative for chills and fever.  HENT: Negative for hearing loss and tinnitus.   Eyes: Negative for blurred vision and double vision.  Respiratory: Negative for cough and hemoptysis.   Cardiovascular: Negative for chest pain and palpitations.  Gastrointestinal: Positive for blood in stool. Negative for heartburn and nausea.       No further bleeding reported overnight  Genitourinary: Negative for dysuria and urgency.  Musculoskeletal: Positive for myalgias.  Skin: Negative for itching and rash.  Neurological: Negative for dizziness and headaches.  Psychiatric/Behavioral: Negative for depression and hallucinations.    DRUG ALLERGIES:   Allergies  Allergen Reactions  . Penicillins   . Shellfish-Derived Products    VITALS:  Blood pressure 93/63, pulse 92, temperature 97.8 F (36.6 C), temperature source Axillary, resp. rate 14, height 5\' 8"  (1.727 m), weight 75.8 kg, SpO2 100 %. PHYSICAL EXAMINATION:   Physical Exam  Constitutional: She appears well-developed and well-nourished.  HENT:  Head: Normocephalic and atraumatic.  Eyes: Pupils are equal, round, and reactive to light. Conjunctivae and EOM are normal.  Neck: Normal range of motion. Neck supple. No tracheal deviation present.  Cardiovascular: Normal rate and regular rhythm.  Respiratory: Effort normal and breath sounds normal.  GI: Soft. Bowel sounds are normal. She exhibits no distension. There is no abdominal tenderness.  Musculoskeletal: Normal range of motion.        General: No tenderness.  Neurological: She is alert.  Generalized weakness    Skin: Skin is warm and dry.   LABORATORY PANEL:  Female CBC Recent Labs  Lab 06/30/18 0521  WBC 12.0*  HGB 8.5*  HCT 26.1*  PLT 99*   ------------------------------------------------------------------------------------------------------------------ Chemistries  Recent Labs  Lab 06/29/18 1450  06/30/18 0521  NA 140  --  143  K 5.1  --  3.6  CL 112*  --  118*  CO2 23  --  20*  GLUCOSE 120*  --  130*  BUN 36*  --  33*  CREATININE 0.67  --  0.62  CALCIUM 8.2*  --  7.7*  MG  --    < > 1.8  AST 17  --   --   ALT <5  --   --   ALKPHOS 34*  --   --   BILITOT 0.6  --   --    < > = values in this interval not displayed.   RADIOLOGY:  Nm Gi Blood Loss  Result Date: 06/29/2018 CLINICAL DATA:  GI bleed beginning yesterday EXAM: NUCLEAR MEDICINE GASTROINTESTINAL BLEEDING SCAN TECHNIQUE: Sequential abdominal images were obtained following intravenous administration of Tc-20m labeled red blood cells. RADIOPHARMACEUTICALS:  23.77 mCi Tc-6m pertechnetate in-vitro labeled red cells. COMPARISON:  None FINDINGS: Normal blood pool distribution of tracer. Beginning at 15 minutes, abnormal gastrointestinal localization of tracer is identified beginning in the far LEFT upper quadrant. Tracer than progresses caudally in the lateral LEFT mid abdomen. Findings are most consistent with acute gastrointestinal bleeding at the region of the splenic flexure of the colon. IMPRESSION: Positive GI bleeding scan for presence of active gastrointestinal bleeding at the  splenic flexure of the colon. Electronically Signed   By: Lavonia Dana M.D.   On: 06/29/2018 22:45   Dg Chest Portable 1 View  Result Date: 06/29/2018 CLINICAL DATA:  Central line placement. EXAM: PORTABLE CHEST 1 VIEW COMPARISON:  None. FINDINGS: Central venous catheter via LEFT internal jugular venous approach with distal tip projecting in proximal superior vena cava. Patient rotated to the RIGHT. Mild cardiomegaly. No pleural effusion or focal  consolidation. No pneumothorax. RIGHT mid lung zone granuloma versus projectional artifact. Skin fold projected chest. Surgical clips in the included right abdomen compatible with cholecystectomy. IMPRESSION: 1. LEFT internal jugular central venous catheter distal tip projects in proximal superior vena cava. No pneumothorax. 2. Mild cardiomegaly, no acute pulmonary process. Electronically Signed   By: Elon Alas M.D.   On: 06/29/2018 18:11   ASSESSMENT AND PLAN:   1.GI bleed with acute blood loss anemia.  No further bleeding this morning. Status post 4 units packed RBC transfusion and 1 unit of 1 FFP Hemodynamically stable this morning.  Recent hemoglobin of 8.5.  Patient reported to have had a positive bleeding scan.  Seen by interventional radiologist for emergent angiography with possible embolization.  There was no evidence of active bleeding.  Not able to find any bleeding source to embolize.  CBC in a.m.  Patient to be monitored in the ICU for another 24 hours.  2.  History of dementia without behavioral disturbance Stable  3.  Hypothyroidism TSH level in a.m.  DVT prophylaxis; SCDs No heparin products due to GI bleed   CODE STATUS: Full Code  TOTAL TIME TAKING CARE OF THIS PATIENT: 33 minutes.   More than 50% of the time was spent in counseling/coordination of care: YES  POSSIBLE D/C IN 2 DAYS, DEPENDING ON CLINICAL CONDITION.   Brynn Mulgrew M.D on 06/30/2018 at 4:25 PM  Between 7am to 6pm - Pager - [phone]  After 6pm go to [email or web address] - Proofreader  [facility]  Office  [phone]  CC: Primary care physician; Gareth Morgan, MD  Note: This dictation was prepared with Dragon dictation along with smaller phrase technology. Any transcriptional errors that result from this process are unintentional.

## 2018-06-30 NOTE — Progress Notes (Signed)
Patient off the unit, to IR for procedure.

## 2018-06-30 NOTE — Progress Notes (Signed)
MD Demetrio Lapping from [facility] able to answer questions for other MDs and can call cell phone at [phone]

## 2018-06-30 NOTE — Progress Notes (Signed)
[place] [employer] Grandson stopped by to visit grandmother. Listed [place] in demographics. Medical providers are able to call him if Fannie Knee is unable to be reached.

## 2018-06-30 NOTE — Clinical Social Work Note (Signed)
Clinical Social Work Assessment  Patient Details  Name: Frances Gardner MRN: [id number] Date of Birth: [birth year]  Date of referral:  06/30/18               Reason for consult:  Discharge Planning                Permission sought to share information with:    Permission granted to share information::     Name::        Agency::     Relationship::     Contact Information:     Housing/Transportation Living arrangements for the past 2 months:  [place] of Information:  Facility Patient Interpreter Needed:  None Criminal Activity/Legal Involvement Pertinent to Current Situation/Hospitalization:  No - Comment as needed Significant Relationships:  grandsons Lives with:  Facility Resident Do you feel safe going back to the place where you live?    Need for family participation in patient care:  Yes (Comment)  Care giving concerns:  Patient is a [facility] care [facility] at [facility].   Social Worker assessment / plan:  CSW consulted due to inability for staff to reach patient's responsible party. CSW contacted [facility] and the the name and number that was placed in the CSW consult was incorrect. Hilda Blades at [facility] could not find another contact. CSW contacted [facility] and was able to speak with their social worker: Thayer Headings who was able to find that [place]: [phone] is the HPOA. Thayer Headings faxed over the [facility]. CSW updated the patient's nurse and took paperwork to patient's chart in ICU.  Employment status:    Insurance information:    PT Recommendations:    Information / Referral to community resources:     Patient/Family's Response to care:  Patient unable to provide any response.  Patient/Family's Understanding of and Emotional Response to Diagnosis, Current Treatment, and Prognosis:  Patient unable to provide response at this time. Patient's grandson should be in town today.  Emotional Assessment Appearance:    Attitude/Demeanor/Rapport:    Affect (typically  observed):    Orientation:  Oriented to Self Alcohol / Substance use:  Not Applicable Psych involvement (Current and /or in the community):  No (Comment)  Discharge Needs  Concerns to be addressed:  Care Coordination Readmission within the last 30 days:  No Current discharge risk:  None Barriers to Discharge:  No Barriers Identified   Shela Leff, LCSW 06/30/2018, 2:58 PM

## 2018-06-30 NOTE — Progress Notes (Signed)
Grandson ([place] [employer]) telephoned in response to message left previously. Grandson updated and would like to be notified when procedure is complete.

## 2018-06-30 NOTE — Procedures (Signed)
Interventional Radiology Procedure Note  Procedure: Selective SMA and IMA arteriography  Complications: None  Estimated Blood Loss: < 10 mL  Findings: Selective SMA and IMA arteriography in multiple projections shows no evidence of active bleeding, arterial pseudoaneurysm, AVM or angiodysplasia.  Not able to find a bleeding source to embolize.  Venetia Night. Kathlene Cote, M.D Pager:  [phone]

## 2018-06-30 NOTE — NC FL2 (Signed)
[place] LEVEL OF CARE SCREENING TOOL     IDENTIFICATION  Patient Name: Frances Gardner Birthdate: 1926/08/13 Sex: female Admission Date (Current Location): 06/29/2018  [place] and [place] Number:  Engineering geologist and Address:  [facility], [address], [place], [place] [address]      Provider Number: [id number]  Attending Physician Name and Address:  Otila Back, MD  Relative Name and Phone Number:       Current Level of Care: Hospital Recommended Level of Care: [place] Prior Approval Number:    Date Approved/Denied:   PASRR Number:    Discharge Plan: SNF    Current Diagnoses: Patient Active Problem List   Diagnosis Date Noted  . Acute blood loss anemia   . Hypotension   . GI bleed 06/29/2018    Orientation RESPIRATION BLADDER Height & Weight     Self  Normal Incontinent Weight: 167 lb 1.7 oz (75.8 kg) Height:  5\' 8"  (172.7 cm)  BEHAVIORAL SYMPTOMS/MOOD NEUROLOGICAL BOWEL NUTRITION STATUS  (none) (none) Incontinent Diet(clear )  AMBULATORY STATUS COMMUNICATION OF NEEDS Skin   Extensive Assist Verbally Normal                       Personal Care Assistance Level of Assistance  Dressing, Bathing Bathing Assistance: Maximum assistance   Dressing Assistance: Maximum assistance     Functional Limitations Info             SPECIAL CARE FACTORS FREQUENCY                       Contractures Contractures Info: Not present    Additional Factors Info  Code Status Code Status Info: full             Current Medications (06/30/2018):  This is the current hospital active medication list Current Facility-Administered Medications  Medication Dose Route Frequency Provider Last Rate Last Dose  . 0.9 %  sodium chloride infusion (Manually program via Guardrails IV Fluids)   Intravenous Once Nance Pear, MD      . acetaminophen (TYLENOL) tablet 650 mg  650 mg Oral Q6H PRN Hillary Bow,  MD       Or  . acetaminophen (TYLENOL) suppository 650 mg  650 mg Rectal Q6H PRN Sudini, Srikar, MD      . albuterol (PROVENTIL) (2.5 MG/3ML) 0.083% nebulizer solution 2.5 mg  2.5 mg Nebulization Q2H PRN Sudini, Srikar, MD      . MEDLINE mouth rinse  15 mL Mouth Rinse BID Tukov-Yual, Magdalene S, NP   15 mL at 06/30/18 0843  . norepinephrine (LEVOPHED) 16 mg in D5W 2109mL premix infusion  0-65 mcg/min Intravenous Titrated Tukov-Yual, Arlyss Gandy, NP   Stopped at 06/30/18 0323  . ondansetron (ZOFRAN) tablet 4 mg  4 mg Oral Q6H PRN Hillary Bow, MD       Or  . ondansetron (ZOFRAN) injection 4 mg  4 mg Intravenous Q6H PRN Sudini, Alveta Heimlich, MD      . pantoprazole (PROTONIX) 80 mg in sodium chloride 0.9 % 250 mL (0.32 mg/mL) infusion  8 mg/hr Intravenous Continuous Hillary Bow, MD 25 mL/hr at 06/30/18 0323 8 mg/hr at 06/30/18 0323  . [START ON 07/03/2018] pantoprazole (PROTONIX) injection 40 mg  40 mg Intravenous Q12H Sudini, Srikar, MD      . sodium chloride 0.9 % bolus 1,000 mL  1,000 mL Intravenous Once Tukov-Yual, Arlyss Gandy, NP      .  sodium chloride 0.9 % bolus 500 mL  500 mL Intravenous Once Sudini, Srikar, MD      . sodium chloride flush (NS) 0.9 % injection 3 mL  3 mL Intravenous Q12H Hillary Bow, MD   3 mL at 06/30/18 3735     Discharge Medications: Please see discharge summary for a list of discharge medications.  Relevant Imaging Results:  Relevant Lab Results:   Additional Information    Shela Leff, LCSW

## 2018-06-30 NOTE — Progress Notes (Signed)
Telephoned patient's grandson Frances Gardner and gave an update on patients status post procedure. Yolanda Bonine stated that he lives in [place] and would be in to visit patient this afternoon.

## 2018-06-30 NOTE — Progress Notes (Signed)
Dr. Bonna Gains telephoned to check up on patients status and bleeding scan results. No bleeding scan results as of yet.

## 2018-06-30 NOTE — Progress Notes (Signed)
Patient is in need of consent for an IR procedure. All contacts in chart have been contacted and messages left. [facility] also contacted for verification of contacts and/or legal guardian. Awaiting callback or response.

## 2018-06-30 NOTE — Consult Note (Signed)
Cephas Darby, MD [address]  [place]  [place], [place] [address]  Main: [phone]  Fax: [phone] Pager: [phone]   Consultation  Referring Provider:     No ref. provider found Primary Care Physician:  Gareth Morgan, MD Primary Gastroenterologist: None       Reason for Consultation:     Hematochezia  Date of Admission:  06/29/2018 Date of Consultation:  06/30/2018         HPI:   Frances Gardner is a 85 y.o. African-American female with history of Parkinson's, resident of nursing home, who presented from [facility] yesterday secondary to several episodes of rectal bleeding associated with altered mental status and hypotension, SBP in 70s.  Patient's hemoglobin on arrival was 7, baseline in 2014 was 9.  Central line was placed, received 4 units of PRBCs and 1 unit of FFP with improvement in blood pressure.  Hemoglobin improved to 10.3 on resuscitation.  Last night, she underwent tagged RBC scan which was positive for bleeding in the splenic flexure.  Subsequently, was seen by interventional radiology, who did not perform embolization as there was no presence of active bleeding source based on SMA and IMA arteriography.  Patient is a poor historian, history obtained from chart review and patient's nurse.  Upon transfer to ICU, patient had small smears of dark red blood per rectum only per her nurse.  Her hemoglobin this morning was 8.5, normal MCV.  Her BUN and creatinine are at baseline.  Patient was empirically started on pantoprazole drip as well, kept n.p.o.  Patient is not on pressors, has been hemodynamically stable.  She is able to speak in sentences, although appears confused because, when I asked her, she said she fell at home, she is able to use a walker and lives at home.  She says she has a grandson and granddaughter and lives with her granddaughter.  Apparently, patient has an appointed guardian who has not been in contact for last 8 years.   NSAIDs:  None  Antiplts/Anticoagulants/Anti thrombotics: None  GI Procedures: Unknown  Past Medical History:  Diagnosis Date  . Dementia ([place])   . Hypothyroidism   . Parkinson's disease ([place])     History reviewed. No pertinent surgical history.  Prior to Admission medications   Medication Sig Start Date End Date Taking? Authorizing Provider  acetaminophen (TYLENOL) 325 MG tablet Take 650 mg by mouth every 6 (six) hours as needed.   Yes [provider]  acetaminophen (TYLENOL) 650 MG CR tablet Take 650 mg by mouth every 8 (eight) hours as needed for pain.   Yes [provider]  carbidopa-levodopa (SINEMET IR) 25-100 MG tablet Take 2 tablets by mouth 3 (three) times daily. 10/17/11  Yes [provider]  cholecalciferol (VITAMIN D) 25 MCG (1000 UT) tablet Take 1,000 Units by mouth daily.   Yes [provider]  guaiFENesin-dextromethorphan (ROBITUSSIN DM) 100-10 MG/5ML syrup Take 5 mLs by mouth every 4 (four) hours as needed for cough.   Yes [provider]  latanoprost (XALATAN) 0.005 % ophthalmic solution Place 1 drop into both eyes daily at 6 (six) AM. 05/30/11  Yes [provider]  mineral oil-hydrophilic petrolatum (AQUAPHOR) ointment Apply 1 application topically Nightly.   Yes [provider]  naloxone (NARCAN) nasal spray 4 mg/0.1 mL Place 1 spray into the nose.   Yes [provider]  senna (SENOKOT) 8.6 MG TABS tablet Take 8.6 mg by mouth 2 (two) times  daily as needed for mild constipation.   Yes [provider]    Current Facility-Administered Medications:  .  0.9 %  sodium chloride infusion (Manually program via Guardrails IV Fluids), , Intravenous, Once, Nance Pear, MD .  acetaminophen (TYLENOL) tablet 650 mg, 650 mg, Oral, Q6H PRN **OR** acetaminophen (TYLENOL) suppository 650 mg, 650 mg, Rectal, Q6H PRN, Sudini, Srikar, MD .  albuterol (PROVENTIL) (2.5 MG/3ML) 0.083% nebulizer solution 2.5 mg, 2.5 mg,  Nebulization, Q2H PRN, Sudini, Srikar, MD .  MEDLINE mouth rinse, 15 mL, Mouth Rinse, BID, Tukov-Yual, Magdalene S, NP, 15 mL at 06/30/18 0843 .  norepinephrine (LEVOPHED) 16 mg in D5W 235m premix infusion, 0-65 mcg/min, Intravenous, Titrated, Tukov-Yual, Magdalene S, NP, Stopped at 06/30/18 0323 .  ondansetron (ZOFRAN) tablet 4 mg, 4 mg, Oral, Q6H PRN **OR** ondansetron (ZOFRAN) injection 4 mg, 4 mg, Intravenous, Q6H PRN, Sudini, Srikar, MD .  pantoprazole (PROTONIX) 80 mg in sodium chloride 0.9 % 250 mL (0.32 mg/mL) infusion, 8 mg/hr, Intravenous, Continuous, Sudini, Srikar, MD, Last Rate: 25 mL/hr at 06/30/18 0323, 8 mg/hr at 06/30/18 0323 .  [START ON 07/03/2018] pantoprazole (PROTONIX) injection 40 mg, 40 mg, Intravenous, Q12H, Sudini, Srikar, MD .  sodium chloride 0.9 % bolus 1,000 mL, 1,000 mL, Intravenous, Once, Tukov-Yual, Magdalene S, NP, Stopped at 06/30/18 0700 .  sodium chloride 0.9 % bolus 500 mL, 500 mL, Intravenous, Once, Sudini, Srikar, MD .  sodium chloride flush (NS) 0.9 % injection 3 mL, 3 mL, Intravenous, Q12H, Sudini, Srikar, MD, 3 mL at 06/30/18 04332 History reviewed. No pertinent family history.   Social History   Tobacco Use  . Smoking status: Unknown If Ever Smoked  . Smokeless tobacco: Never Used  Substance Use Topics  . Alcohol use: Not on file  . Drug use: Not on file    Allergies as of 06/29/2018 - Review Complete 06/29/2018  Allergen Reaction Noted  . Penicillins  06/29/2018  . Shellfish-derived products  06/29/2018    Review of Systems:    All systems reviewed and negative except where noted in HPI.   Physical Exam:  Vital signs in last 24 hours: Temp:  [97.5 F (36.4 C)-98.8 F (37.1 C)] 97.8 F (36.6 C) (01/06 0715) Pulse Rate:  [72-120] 92 (01/06 1400) Resp:  [9-29] 14 (01/06 1400) BP: (71-136)/(51-107) 93/63 (01/06 1400) SpO2:  [77 %-100 %] 100 % (01/06 1400) Weight:  [75.8 kg-77.4 kg] 75.8 kg (01/06 0545) Last BM Date: 06/30/18 General:    Pleasant, cooperative in NAD Head:  Normocephalic and atraumatic. Eyes:   No icterus.   Conjunctiva pink. PERRLA, proptosis, asymmetric eyeballs Ears:  Normal auditory acuity. Neck:  Supple; no masses or thyroidomegaly Lungs: Respirations even and unlabored. Lungs clear to auscultation bilaterally.   No wheezes, crackles, or rhonchi.  Heart:  Regular rate and rhythm;  Without murmur, clicks, rubs or gallops Abdomen:  Soft, nondistended, nontender. Normal bowel sounds. No appreciable masses or hepatomegaly.  No rebound or guarding.  Rectal:  Not performed. Msk:  Symmetrical, bilateral drop feet Extremities:  1+ edema, no cyanosis or clubbing. Neurologic:  Alert and oriented x1; Skin:  Intact without significant lesions or rashes. Cervical Nodes:  No significant cervical adenopathy. Psych:  Alert and cooperative. Normal affect.  LAB RESULTS: CBC Latest Ref Rng & Units 06/30/2018 06/29/2018 06/29/2018  WBC 4.0 - 10.5 K/uL 12.0(H) 12.0(H) -  Hemoglobin 12.0 - 15.0 g/dL 8.5(L) 9.6(L) 10.3(L)  Hematocrit 36.0 - 46.0 % 26.1(L) 29.8(L) 32.2(L)  Platelets 150 -  400 K/uL 99(L) 105(L) -    BMET BMP Latest Ref Rng & Units 06/30/2018 06/29/2018 10/17/2012  Glucose 70 - 99 mg/dL 130(H) 120(H) 116(H)  BUN 8 - 23 mg/dL 33(H) 36(H) 29(H)  Creatinine 0.44 - 1.00 mg/dL 0.62 0.67 1.50(H)  Sodium 135 - 145 mmol/L 143 140 138  Potassium 3.5 - 5.1 mmol/L 3.6 5.1 4.2  Chloride 98 - 111 mmol/L 118(H) 112(H) 103  CO2 22 - 32 mmol/L 20(L) 23 28  Calcium 8.9 - 10.3 mg/dL 7.7(L) 8.2(L) 8.7    LFT Hepatic Function Latest Ref Rng & Units 06/29/2018 10/17/2012  Total Protein 6.5 - 8.1 g/dL 5.2(L) 7.5  Albumin 3.5 - 5.0 g/dL 2.6(L) 3.0(L)  AST 15 - 41 U/L 17 16  ALT 0 - 44 U/L <5 8(L)  Alk Phosphatase 38 - 126 U/L 34(L) 84  Total Bilirubin 0.3 - 1.2 mg/dL 0.6 0.4     STUDIES: Nm Gi Blood Loss  Result Date: 06/29/2018 CLINICAL DATA:  GI bleed beginning yesterday EXAM: NUCLEAR MEDICINE GASTROINTESTINAL BLEEDING  SCAN TECHNIQUE: Sequential abdominal images were obtained following intravenous administration of Tc-58mlabeled red blood cells. RADIOPHARMACEUTICALS:  23.77 mCi Tc-987mertechnetate in-vitro labeled red cells. COMPARISON:  None FINDINGS: Normal blood pool distribution of tracer. Beginning at 15 minutes, abnormal gastrointestinal localization of tracer is identified beginning in the far LEFT upper quadrant. Tracer than progresses caudally in the lateral LEFT mid abdomen. Findings are most consistent with acute gastrointestinal bleeding at the region of the splenic flexure of the colon. IMPRESSION: Positive GI bleeding scan for presence of active gastrointestinal bleeding at the splenic flexure of the colon. Electronically Signed   By: MaLavonia Dana.D.   On: 06/29/2018 22:45   Dg Chest Portable 1 View  Result Date: 06/29/2018 CLINICAL DATA:  Central line placement. EXAM: PORTABLE CHEST 1 VIEW COMPARISON:  None. FINDINGS: Central venous catheter via LEFT internal jugular venous approach with distal tip projecting in proximal superior vena cava. Patient rotated to the RIGHT. Mild cardiomegaly. No pleural effusion or focal consolidation. No pneumothorax. RIGHT mid lung zone granuloma versus projectional artifact. Skin fold projected chest. Surgical clips in the included right abdomen compatible with cholecystectomy. IMPRESSION: 1. LEFT internal jugular central venous catheter distal tip projects in proximal superior vena cava. No pneumothorax. 2. Mild cardiomegaly, no acute pulmonary process. Electronically Signed   By: CoElon Alas.D.   On: 06/29/2018 18:11      Impression / Plan:   Frances Santalucias a [age].o. African-American female with history of dementia, nursing home resident who is admitted with hematochezia and hypotension secondary to acute GI bleed.  Her history is consistent with lower GI bleed and positive bleeding scan with bleeding source located in splenic flexure.  Patient is currently  hemodynamically stable as her bleeding has slowed down and after blood transfusion.  Did not undergo embolization as there was no active extravasation on angiography.  I attempted to reach her grandson at the number listed in her chart, he did not pick up the phone and his mailbox is full.  Please start clear liquid diet.  Okay to continue pantoprazole drip although very less likely an upper GI source.  As patient is not currently bleeding and unable to reach family or POA, given her age and advanced dementia, I will monitor her clinically unless her bleeding recurs.  At that time, I will perform colonoscopy  Thank you for involving me in the care of this patient.  Will follow along  with you    LOS: 1 day   Sherri Sear, MD  06/30/2018, 4:28 PM   Note: This dictation was prepared with Dragon dictation along with smaller phrase technology. Any transcriptional errors that result from this process are unintentional.

## 2018-07-01 ENCOUNTER — Inpatient Hospital Stay: Payer: Medicare (Managed Care)

## 2018-07-01 ENCOUNTER — Encounter: Payer: Self-pay | Admitting: Interventional Radiology

## 2018-07-01 DIAGNOSIS — I9589 Other hypotension: Secondary | ICD-10-CM

## 2018-07-01 LAB — CBC
HCT: 24.6 % — ABNORMAL LOW (ref 36.0–46.0)
Hemoglobin: 8 g/dL — ABNORMAL LOW (ref 12.0–15.0)
MCH: 28.2 pg (ref 26.0–34.0)
MCHC: 32.5 g/dL (ref 30.0–36.0)
MCV: 86.6 fL (ref 80.0–100.0)
Platelets: 98 10*3/uL — ABNORMAL LOW (ref 150–400)
RBC: 2.84 MIL/uL — ABNORMAL LOW (ref 3.87–5.11)
RDW: 15.3 % (ref 11.5–15.5)
WBC: 9.7 10*3/uL (ref 4.0–10.5)
nRBC: 0 % (ref 0.0–0.2)

## 2018-07-01 LAB — BASIC METABOLIC PANEL
Anion gap: 2 — ABNORMAL LOW (ref 5–15)
BUN: 26 mg/dL — ABNORMAL HIGH (ref 8–23)
CO2: 22 mmol/L (ref 22–32)
Calcium: 7.7 mg/dL — ABNORMAL LOW (ref 8.9–10.3)
Chloride: 119 mmol/L — ABNORMAL HIGH (ref 98–111)
Creatinine, Ser: 0.6 mg/dL (ref 0.44–1.00)
Glucose, Bld: 103 mg/dL — ABNORMAL HIGH (ref 70–99)
Potassium: 3.2 mmol/L — ABNORMAL LOW (ref 3.5–5.1)
SODIUM: 143 mmol/L (ref 135–145)

## 2018-07-01 LAB — AMMONIA: Ammonia: 27 umol/L (ref 9–35)

## 2018-07-01 LAB — PHOSPHORUS: Phosphorus: 2.4 mg/dL — ABNORMAL LOW (ref 2.5–4.6)

## 2018-07-01 LAB — MAGNESIUM: Magnesium: 1.9 mg/dL (ref 1.7–2.4)

## 2018-07-01 LAB — TSH: TSH: 0.873 u[IU]/mL (ref 0.350–4.500)

## 2018-07-01 MED ORDER — SODIUM CHLORIDE 0.9% IV SOLUTION
Freq: Once | INTRAVENOUS | Status: AC
  Administered 2018-07-01: 02:00:00 via INTRAVENOUS

## 2018-07-01 MED ORDER — PEG 3350-KCL-NA BICARB-NACL 420 G PO SOLR
4000.0000 mL | Freq: Once | ORAL | Status: AC
  Administered 2018-07-01: 4000 mL via ORAL
  Filled 2018-07-01: qty 4000

## 2018-07-01 MED ORDER — POTASSIUM CHLORIDE 20 MEQ PO PACK
20.0000 meq | PACK | Freq: Once | ORAL | Status: AC
  Administered 2018-07-01: 20 meq via ORAL
  Filled 2018-07-01: qty 1

## 2018-07-01 MED ORDER — POLYETHYLENE GLYCOL 3350 17 G PO PACK
17.0000 g | PACK | Freq: Every day | ORAL | Status: DC
  Administered 2018-07-01: 17 g via ORAL
  Filled 2018-07-01: qty 1

## 2018-07-01 NOTE — Treatment Plan (Signed)
The patient was discussed during multidisciplinary rounds.  There has been no further signs of bleeding.  However she did have to be transfused 1 unit of blood last night.  It appears that GI will proceed with endoscopy.  Of note the patient's PACE program physician was present during multidisciplinary rounds.  From our standpoint patient may be transferred to [place] ward as she has remained hemodynamically stable.  However currently beds are not available so she will remain in her current stepdown bed.Marland Kitchen

## 2018-07-01 NOTE — Progress Notes (Signed)
[place] [facility] [place] NAME: Frances Gardner    MR#:  [id number]  DATE OF BIRTH:  02/14/1927  SUBJECTIVE:  CHIEF COMPLAINT:   Chief Complaint  Patient presents with  . Altered Mental Status   No new complaint this morning.  No further bleeding reported.    REVIEW OF SYSTEMS:  Review of Systems  Constitutional: Negative for chills and fever.  HENT: Negative for hearing loss and tinnitus.   Eyes: Negative for blurred vision and double vision.  Respiratory: Negative for cough and hemoptysis.   Cardiovascular: Negative for chest pain and palpitations.  Gastrointestinal: Positive for blood in stool. Negative for heartburn and nausea.       No further bleeding reported overnight  Genitourinary: Negative for dysuria and urgency.  Musculoskeletal: Positive for myalgias.  Skin: Negative for itching and rash.  Neurological: Negative for dizziness and headaches.  Psychiatric/Behavioral: Negative for depression and hallucinations.    DRUG ALLERGIES:   Allergies  Allergen Reactions  . Penicillins   . Shellfish-Derived Products    VITALS:  Blood pressure (!) 107/57, pulse 69, temperature 98.2 F (36.8 C), temperature source Axillary, resp. rate 16, height 5\' 8"  (1.727 m), weight 75.8 kg, SpO2 100 %. PHYSICAL EXAMINATION:   Physical Exam  Constitutional: She appears well-developed and well-nourished.  HENT:  Head: Normocephalic and atraumatic.  Eyes: Pupils are equal, round, and reactive to light. Conjunctivae and EOM are normal.  Neck: Normal range of motion. Neck supple. No tracheal deviation present.  Cardiovascular: Normal rate and regular rhythm.  Respiratory: Effort normal and breath sounds normal.  GI: Soft. Bowel sounds are normal. She exhibits no distension. There is no abdominal tenderness.  Musculoskeletal: Normal range of motion.        General: No tenderness.  Neurological: She is alert.  Generalized weakness  Skin: Skin is  warm and dry.   LABORATORY PANEL:  Female CBC Recent Labs  Lab 07/01/18 0548  WBC 9.7  HGB 8.0*  HCT 24.6*  PLT 98*   ------------------------------------------------------------------------------------------------------------------ Chemistries  Recent Labs  Lab 06/29/18 1450  07/01/18 0548  NA 140   < > 143  K 5.1   < > 3.2*  CL 112*   < > 119*  CO2 23   < > 22  GLUCOSE 120*   < > 103*  BUN 36*   < > 26*  CREATININE 0.67   < > 0.60  CALCIUM 8.2*   < > 7.7*  MG  --    < > 1.9  AST 17  --   --   ALT <5  --   --   ALKPHOS 34*  --   --   BILITOT 0.6  --   --    < > = values in this interval not displayed.   RADIOLOGY:  US Abdomen Complete  Result Date: 07/01/2018 CLINICAL DATA:  Anemia.  Low platelet count. EXAM: ABDOMEN ULTRASOUND COMPLETE COMPARISON:  No recent prior. FINDINGS: Gallbladder: Gallbladder was not visualized. Common bile duct: Diameter: 3 mm Liver: No focal lesion identified. Within normal limits in parenchymal echogenicity. Portal vein is patent on color Doppler imaging with normal direction of blood flow towards the liver. IVC: No abnormality visualized. Pancreas: Visualized portion unremarkable. Spleen: Size and appearance within normal limits. Right Kidney: Length: 10.2 cm. Echogenicity within normal limits. 0.9 cm simple cyst. No hydronephrosis visualized. Left Kidney: Length: 9.0 cm. Echogenicity within normal limits. 3.5 cm simple cyst. No hydronephrosis  visualized. Abdominal aorta: No aneurysm visualized. Other findings: None. IMPRESSION: 1.  Liver and spleen appear normal. 2.  Gallbladder not visualized.  No biliary distention. 3.  Simple cyst both kidneys. Electronically Signed   By: Marcello Moores  Register   On: 07/01/2018 12:51   ASSESSMENT AND PLAN:   1.GI bleed with acute blood loss anemia.  No further bleeding this morning. Status post 4 units packed RBC transfusion and 1 unit of 1 FFP Hemodynamically stable this morning.  Recent hemoglobin of  8.0 Patient reported to have had a positive bleeding scan.  Seen by interventional radiologist for emergent angiography with possible embolization.  There was no evidence of active bleeding.  Not able to find any bleeding source to embolize.  Patient had an abdominal ultrasound done today.  Liver and spleen normal.  Gallbladder not visualized.  Simple cyst in both kidneys. CBC in a.m.   Anticipate transfer out of ICU soon.  2.  History of dementia without behavioral disturbance Stable  3.  Hypothyroidism TSH level normal  4.  Hypokalemia Replaced.  Follow-up on repeat levels in a.m.  DVT prophylaxis; SCDs No heparin products due to GI bleed   CODE STATUS: Full Code  TOTAL TIME TAKING CARE OF THIS PATIENT: 33 minutes.   More than 50% of the time was spent in counseling/coordination of care: YES  POSSIBLE D/C IN 1- 2 DAYS, DEPENDING ON CLINICAL CONDITION.  To coordinate with case manager regarding discharge plans once patient out of ICU.  Patient in the PACE program   Glen Elder.D on 07/01/2018 at 5:08 PM  Between 7am to 6pm - Pager - [phone]  After 6pm go to [email or web address] - Proofreader  [facility]  Office  [phone]  CC: Primary care physician; Gareth Morgan, MD  Note: This dictation was prepared with Dragon dictation along with smaller phrase technology. Any transcriptional errors that result from this process are unintentional.

## 2018-07-01 NOTE — Progress Notes (Signed)
Patient received bed approval to room #225. Called report to [facility]. Patient vital signs stable. Will transport patient via bed. Tele Sitter will go with patient. Notified Jamal via telephone, he verbalized understanding and notified Juliann Pulse the [facility] Nurse.

## 2018-07-01 NOTE — Care Management Note (Signed)
Case Management Note  Patient Details  Name: Makinsley Schiavi MRN: [id number] Date of Birth: [birth year]  Subjective/Objective: Patient admitted with GI bleed and altered mental status.  Patient mentation has improved back to baseline and there has been no further GI bleeding.      Patient is part of the PACE program and PACE will coordinate all needed outpatient services at discharge.  Patient is from [facility] and is bed bound at baseline.  RNCM will cont to follow patient progress during hospitalization. Doran Clay RN BSN [phone]                 Action/Plan:   Expected Discharge Date:                  Expected Discharge Plan:  Assisted Living / Rest Home  In-House Referral:     Discharge planning Services  CM Consult  Post Acute Care Choice:    Choice offered to:     DME Arranged:    DME Agency:     HH Arranged:    [place] Agency:     Status of Service:  In process, will continue to follow  If discussed at Long Length of Stay Meetings, dates discussed:    Additional Comments:  Shelbie Hutching, RN 07/01/2018, 2:10 PM

## 2018-07-01 NOTE — Progress Notes (Signed)
Cephas Darby, MD [address]  [place]  [place], [place] [address]  Main: [phone]  Fax: [phone] Pager: [phone]   Subjective: No acute events overnight.  Patient's hemoglobin dropped to 7.8 last night, received 1 unit of PRBCs and hemoglobin responded appropriately.  She did not have any further episodes of bleeding in last 24 hours.  Social worker was able to contact patient's grandson who makes to medical decisions for her.  Patient has been tolerating clear liquid diet well and able to hold appropriate conversation intermittently   Objective: Vital signs in last 24 hours: Vitals:   07/01/18 1100 07/01/18 1200 07/01/18 1300 07/01/18 1400  BP: (!) 96/57 (!) 108/53  (!) 107/57  Pulse: 66 70 75 69  Resp: 17 15 (!) 24 16  Temp:      TempSrc:      SpO2: 100% 100% 100% 100%  Weight:      Height:       Weight change: -19.5 kg  Intake/Output Summary (Last 24 hours) at 07/01/2018 1719 Last data filed at 07/01/2018 1613 Gross per 24 hour  Intake 1414.5 ml  Output 500 ml  Net 914.5 ml     Exam: Heart:: Regular rate and rhythm or S1S2 present Lungs: normal and clear to auscultation Abdomen: soft, nontender, normal bowel sounds   Lab Results: CBC Latest Ref Rng & Units 07/01/2018 06/30/2018 06/30/2018  WBC 4.0 - 10.5 K/uL 9.7 11.5(H) 12.0(H)  Hemoglobin 12.0 - 15.0 g/dL 8.0(L) 7.3(L) 8.5(L)  Hematocrit 36.0 - 46.0 % 24.6(L) 22.4(L) 26.1(L)  Platelets 150 - 400 K/uL 98(L) 108(L) 99(L)   CMP Latest Ref Rng & Units 07/01/2018 06/30/2018 06/29/2018  Glucose 70 - 99 mg/dL 103(H) 130(H) 120(H)  BUN 8 - 23 mg/dL 26(H) 33(H) 36(H)  Creatinine 0.44 - 1.00 mg/dL 0.60 0.62 0.67  Sodium 135 - 145 mmol/L 143 143 140  Potassium 3.5 - 5.1 mmol/L 3.2(L) 3.6 5.1  Chloride 98 - 111 mmol/L 119(H) 118(H) 112(H)  CO2 22 - 32 mmol/L 22 20(L) 23  Calcium 8.9 - 10.3 mg/dL 7.7(L) 7.7(L) 8.2(L)  Total Protein 6.5 - 8.1 g/dL - - 5.2(L)  Total Bilirubin 0.3 - 1.2 mg/dL - - 0.6    Alkaline Phos 38 - 126 U/L - - 34(L)  AST 15 - 41 U/L - - 17  ALT 0 - 44 U/L - - <5   Micro Results: Recent Results (from the past 240 hour(s))  Culture, blood (Routine x 2)     Status: None (Preliminary result)   Collection Time: 06/29/18  2:50 PM  Result Value Ref Range Status   Specimen Description BLOOD BLOOD LEFT HAND  Final   Special Requests   Final    BOTTLES DRAWN AEROBIC AND ANAEROBIC Blood Culture adequate volume   Culture   Final    NO GROWTH 2 DAYS Performed at [facility], [address] [place], [place] [address]    Report Status PENDING  Incomplete  MRSA PCR Screening     Status: None   Collection Time: 06/29/18  6:36 PM  Result Value Ref Range Status   MRSA by PCR NEGATIVE NEGATIVE Final    Comment:        The GeneXpert MRSA Assay (FDA approved for NASAL specimens only), is one component of a comprehensive MRSA colonization surveillance program. It is not intended to diagnose MRSA infection nor to guide or monitor treatment for MRSA infections. Performed at [facility], [address] [place], [place] [address]    Studies/Results: Nm Gi Blood Loss  Result Date: 06/29/2018 CLINICAL DATA:  GI bleed beginning yesterday EXAM: NUCLEAR MEDICINE GASTROINTESTINAL BLEEDING SCAN TECHNIQUE: Sequential abdominal images were obtained following intravenous administration of Tc-77m labeled red blood cells. RADIOPHARMACEUTICALS:  23.77 mCi Tc-59m pertechnetate in-vitro labeled red cells. COMPARISON:  None FINDINGS: Normal blood pool distribution of tracer. Beginning at 15 minutes, abnormal gastrointestinal localization of tracer is identified beginning in the far LEFT upper quadrant. Tracer than progresses caudally in the lateral LEFT mid abdomen. Findings are most consistent with acute gastrointestinal bleeding at the region of the splenic flexure of the colon. IMPRESSION: Positive GI bleeding scan for presence of active gastrointestinal bleeding at the  splenic flexure of the colon. Electronically Signed   By: Lavonia Dana M.D.   On: 06/29/2018 22:45   US Abdomen Complete  Result Date: 07/01/2018 CLINICAL DATA:  Anemia.  Low platelet count. EXAM: ABDOMEN ULTRASOUND COMPLETE COMPARISON:  No recent prior. FINDINGS: Gallbladder: Gallbladder was not visualized. Common bile duct: Diameter: 3 mm Liver: No focal lesion identified. Within normal limits in parenchymal echogenicity. Portal vein is patent on color Doppler imaging with normal direction of blood flow towards the liver. IVC: No abnormality visualized. Pancreas: Visualized portion unremarkable. Spleen: Size and appearance within normal limits. Right Kidney: Length: 10.2 cm. Echogenicity within normal limits. 0.9 cm simple cyst. No hydronephrosis visualized. Left Kidney: Length: 9.0 cm. Echogenicity within normal limits. 3.5 cm simple cyst. No hydronephrosis visualized. Abdominal aorta: No aneurysm visualized. Other findings: None. IMPRESSION: 1.  Liver and spleen appear normal. 2.  Gallbladder not visualized.  No biliary distention. 3.  Simple cyst both kidneys. Electronically Signed   By: Marcello Moores  Register   On: 07/01/2018 12:51   Ir Angiogram Visceral Selective  Result Date: 07/01/2018 INDICATION: Active lower GI bleed requiring transfusion and positive nuclear medicine bleeding scan with bleeding localizing to the region of the splenic flexure of the colon. EXAM: 1. ULTRASOUND GUIDANCE FOR VASCULAR ACCESS OF THE RIGHT COMMON FEMORAL ARTERY 2. SELECTIVE ARTERIOGRAPHY OF THE SUPERIOR MESENTERIC ARTERY 3. SELECTIVE ARTERIOGRAPHY OF THE INFERIOR MESENTERIC ARTERY MEDICATIONS: None ANESTHESIA/SEDATION: Moderate (conscious) sedation was employed during this procedure. A total of Versed 0.5 mg and Fentanyl 25 mcg was administered intravenously. Moderate Sedation Time: 45 minutes. The patient's level of consciousness and vital signs were monitored continuously by radiology nursing throughout the procedure under my  direct supervision. CONTRAST:  80 mL Isovue-300 FLUOROSCOPY TIME:  Fluoroscopy Time: 9 minutes and 18 seconds. 701 mGy. COMPLICATIONS: None immediate. PROCEDURE: Due to emergent need for performance of the procedure and inability to reach the patient's grandchildren or social services, emergent consent was obtained from the critical care service to perform arteriography. A time out was performed prior to the initiation of the procedure. Maximal barrier sterile technique utilized including caps, mask, sterile gowns, sterile gloves, large sterile drape, hand hygiene, and chlorhexidine prep. Ultrasound was used to confirm patency of the right common femoral artery. Under direct ultrasound guidance, access of the right common femoral artery was performed with a micropuncture set. After guidewire access, a 5 French vascular sheath was placed. A 5 French Cobra catheter was then advanced into the abdominal aorta. This was used to selectively catheterize the superior mesenteric artery. Arteriography was performed in 2 different projections. The Cobra catheter was exchanged for a reverse curved visceral catheter. This was used to selectively catheterize the inferior mesenteric artery. Selective arteriography was performed in 2 different projections. After catheter and  sheath removal, hemostasis was obtained with manual compression. FINDINGS: Superior mesenteric arteriography demonstrates no evidence of arterial bleeding, pseudoaneurysm, AV malformation or angiodysplasia. Vessel supplying small bowel and proximal colon show normal patency and are constricted, likely due to current hypovolemia and hypotension. Inferior mesenteric arteriography demonstrates normal patency of vessels supplying the descending colon, sigmoid colon and rectum without evidence of bleeding, pseudoaneurysm, AV malformation or angiodysplasia. IMPRESSION: Normal select arteriography of the superior and inferior mesenteric arteries. No active bleeding  source or vascular abnormality was identified as a target for transcatheter embolization. Electronically Signed   By: Aletta Edouard M.D.   On: 07/01/2018 11:51   Dg Chest Portable 1 View  Result Date: 06/29/2018 CLINICAL DATA:  Central line placement. EXAM: PORTABLE CHEST 1 VIEW COMPARISON:  None. FINDINGS: Central venous catheter via LEFT internal jugular venous approach with distal tip projecting in proximal superior vena cava. Patient rotated to the RIGHT. Mild cardiomegaly. No pleural effusion or focal consolidation. No pneumothorax. RIGHT mid lung zone granuloma versus projectional artifact. Skin fold projected chest. Surgical clips in the included right abdomen compatible with cholecystectomy. IMPRESSION: 1. LEFT internal jugular central venous catheter distal tip projects in proximal superior vena cava. No pneumothorax. 2. Mild cardiomegaly, no acute pulmonary process. Electronically Signed   By: Elon Alas M.D.   On: 06/29/2018 18:11   Medications:  I have reviewed the patient's current medications. Prior to Admission:  Medications Prior to Admission  Medication Sig Dispense Refill Last Dose  . acetaminophen (TYLENOL) 325 MG tablet Take 650 mg by mouth every 6 (six) hours as needed.   prn at prn  . acetaminophen (TYLENOL) 650 MG CR tablet Take 650 mg by mouth every 8 (eight) hours as needed for pain.   06/29/2018 at 0909  . carbidopa-levodopa (SINEMET IR) 25-100 MG tablet Take 2 tablets by mouth 3 (three) times daily.   06/29/2018 at 0909  . cholecalciferol (VITAMIN D) 25 MCG (1000 UT) tablet Take 1,000 Units by mouth daily.   06/29/2018 at 0909  . guaiFENesin-dextromethorphan (ROBITUSSIN DM) 100-10 MG/5ML syrup Take 5 mLs by mouth every 4 (four) hours as needed for cough.   prn at prn  . latanoprost (XALATAN) 0.005 % ophthalmic solution Place 1 drop into both eyes daily at 6 (six) AM.   06/28/2018 at 2328  . mineral oil-hydrophilic petrolatum (AQUAPHOR) ointment Apply 1 application  topically Nightly.   06/29/2018 at 0145  . naloxone [facility]) nasal spray 4 mg/0.1 mL Place 1 spray into the nose.   prn at prn  . senna (SENOKOT) 8.6 MG TABS tablet Take 8.6 mg by mouth 2 (two) times daily as needed for mild constipation.   06/29/2018 at 0909   Scheduled: . sodium chloride   Intravenous Once  . mouth rinse  15 mL Mouth Rinse BID  . [START ON 07/03/2018] pantoprazole  40 mg Intravenous Q12H  . polyethylene glycol-electrolytes  4,000 mL Oral Once  . potassium chloride  20 mEq Oral Once  . sodium chloride flush  3 mL Intravenous Q12H   Continuous: . norepinephrine (LEVOPHED) Adult infusion Stopped (06/30/18 0323)  . pantoprozole (PROTONIX) infusion Stopped (07/01/18 1514)  . sodium chloride Stopped (06/30/18 0700)  . sodium chloride     QMV:[id number] **OR** acetaminophen, albuterol, ondansetron **OR** ondansetron (ZOFRAN) IV Scheduled Meds: . sodium chloride   Intravenous Once  . mouth rinse  15 mL Mouth Rinse BID  . [START ON 07/03/2018] pantoprazole  40 mg Intravenous Q12H  . polyethylene glycol-electrolytes  4,000  mL Oral Once  . potassium chloride  20 mEq Oral Once  . sodium chloride flush  3 mL Intravenous Q12H   Continuous Infusions: . norepinephrine (LEVOPHED) Adult infusion Stopped (06/30/18 0323)  . pantoprozole (PROTONIX) infusion Stopped (07/01/18 1514)  . sodium chloride Stopped (06/30/18 0700)  . sodium chloride     PRN Meds:.acetaminophen **OR** acetaminophen, albuterol, ondansetron **OR** ondansetron (ZOFRAN) IV   Assessment: Active Problems:   GI bleed   Acute blood loss anemia   Hypotension  Hematochezia, with worsening anemia in last 24 hours and patient required a minute of PRBCs.  Patient is not actively bleeding at this time There is no evidence of cirrhosis or portal hypertension.  Patient had ultrasound liver with Dopplers and it was negative  Plan: I had in-depth conversation with patient's grandson, [place] over phone.  I  explained to him about endoscopic evaluation to evaluate the source of blood loss since patient presented with hemodynamically significant GI bleed.  I discussed about the risk and benefits of the upper endoscopy and colonoscopy and he is agreeable.  We will tentatively plan for EGD and colonoscopy tomorrow Continue clear liquid diet Bowel prep tonight Continue pantoprazole drip Monitor CBC closely and transfuse as needed  We will follow along with you   LOS: 2 days   Anas Reister 07/01/2018, 5:19 PM

## 2018-07-01 NOTE — Progress Notes (Signed)
[facility] Nurse contact information [phone] Juliann Pulse, leave voicemail if she doesn't answer and she will call back.

## 2018-07-02 ENCOUNTER — Encounter: Payer: Self-pay | Admitting: Anesthesiology

## 2018-07-02 DIAGNOSIS — D696 Thrombocytopenia, unspecified: Secondary | ICD-10-CM

## 2018-07-02 LAB — BPAM PLATELET PHERESIS
BLOOD PRODUCT EXPIRATION DATE: 202001062359
Unit Type and Rh: 5100

## 2018-07-02 LAB — PREPARE PLATELET PHERESIS: Unit division: 0

## 2018-07-02 LAB — IRON AND TIBC
Iron: 27 ug/dL — ABNORMAL LOW (ref 28–170)
Saturation Ratios: 12 % (ref 10.4–31.8)
TIBC: 220 ug/dL — ABNORMAL LOW (ref 250–450)
UIBC: 193 ug/dL

## 2018-07-02 LAB — BASIC METABOLIC PANEL
Anion gap: 4 — ABNORMAL LOW (ref 5–15)
BUN: 26 mg/dL — ABNORMAL HIGH (ref 8–23)
CO2: 21 mmol/L — ABNORMAL LOW (ref 22–32)
Calcium: 7.9 mg/dL — ABNORMAL LOW (ref 8.9–10.3)
Chloride: 120 mmol/L — ABNORMAL HIGH (ref 98–111)
Creatinine, Ser: 0.59 mg/dL (ref 0.44–1.00)
GFR calc non Af Amer: >60 mL/min (ref >60)
Glucose, Bld: 91 mg/dL (ref 70–99)
Potassium: 3.5 mmol/L (ref 3.5–5.1)
Sodium: 145 mmol/L (ref 135–145)

## 2018-07-02 LAB — CBC
HCT: 24.5 % — ABNORMAL LOW (ref 36.0–46.0)
Hemoglobin: 7.9 g/dL — ABNORMAL LOW (ref 12.0–15.0)
MCH: 28.8 pg (ref 26.0–34.0)
MCHC: 32.2 g/dL (ref 30.0–36.0)
MCV: 89.4 fL (ref 80.0–100.0)
NRBC: 0 % (ref 0.0–0.2)
Platelets: 105 10*3/uL — ABNORMAL LOW (ref 150–400)
RBC: 2.74 MIL/uL — ABNORMAL LOW (ref 3.87–5.11)
RDW: 16.1 % — ABNORMAL HIGH (ref 11.5–15.5)
WBC: 7.7 10*3/uL (ref 4.0–10.5)

## 2018-07-02 LAB — FOLATE: Folate: 15.3 ng/mL (ref >5.9)

## 2018-07-02 LAB — FERRITIN: Ferritin: 45 ng/mL (ref 11–307)

## 2018-07-02 LAB — VITAMIN B12: Vitamin B-12: 516 pg/mL (ref 180–914)

## 2018-07-02 LAB — MAGNESIUM: Magnesium: 1.9 mg/dL (ref 1.7–2.4)

## 2018-07-02 MED ORDER — LATANOPROST 0.005 % OP SOLN
1.0000 [drp] | Freq: Every day | OPHTHALMIC | Status: DC
  Administered 2018-07-02: 1 [drp] via OPHTHALMIC
  Filled 2018-07-02: qty 2.5

## 2018-07-02 MED ORDER — MAGNESIUM CITRATE PO SOLN
1.0000 | Freq: Once | ORAL | Status: AC
  Administered 2018-07-02: 1 via ORAL
  Filled 2018-07-02: qty 296

## 2018-07-02 MED ORDER — POLYETHYLENE GLYCOL 3350 17 GM/SCOOP PO POWD
1.0000 | Freq: Once | ORAL | Status: AC
  Administered 2018-07-02: 255 g via ORAL
  Filled 2018-07-02: qty 255

## 2018-07-02 NOTE — Clinical Social Work Note (Signed)
Patient not yet ready for discharge back to [facility]. Shela Leff MSW,LCSW [phone]

## 2018-07-02 NOTE — Care Management Important Message (Signed)
Patient unable to sign Medicare IM.  No family in room.  Copy left for reference.

## 2018-07-02 NOTE — Progress Notes (Signed)
Verbal order placed for a clear liquid diet- Dr. Marius Ditch

## 2018-07-02 NOTE — Progress Notes (Signed)
[place] at [place] NAME: Frances Gardner    MR#:  [id number]  DATE OF BIRTH:  [birth year]  SUBJECTIVE:  CHIEF COMPLAINT:   Chief Complaint  Patient presents with  . Altered Mental Status   No new complaint this morning.  No further bleeding reported.  Seen by gastroenterologist.  Plan was for possible colonoscopy but patient was not able to do bowel prep overnight.  REVIEW OF SYSTEMS:  Review of Systems  Constitutional: Negative for chills and fever.  HENT: Negative for hearing loss and tinnitus.   Eyes: Negative for blurred vision and double vision.  Respiratory: Negative for cough and hemoptysis.   Cardiovascular: Negative for chest pain and palpitations.  Gastrointestinal: Positive for blood in stool. Negative for heartburn and nausea.       No further bleeding reported overnight  Genitourinary: Negative for dysuria and urgency.  Musculoskeletal: Positive for myalgias.  Skin: Negative for itching and rash.  Neurological: Negative for dizziness and headaches.  Psychiatric/Behavioral: Negative for depression and hallucinations.    DRUG ALLERGIES:   Allergies  Allergen Reactions  . Penicillins   . Shellfish-Derived Products    VITALS:  Blood pressure (!) 98/58, pulse 73, temperature (!) 97.4 F (36.3 C), temperature source Oral, resp. rate 20, height 5\' 8"  (1.727 m), weight 77.6 kg, SpO2 99 %. PHYSICAL EXAMINATION:   Physical Exam  Constitutional: She appears well-developed and well-nourished.  HENT:  Head: Normocephalic and atraumatic.  Eyes: Pupils are equal, round, and reactive to light. Conjunctivae and EOM are normal.  Neck: Normal range of motion. Neck supple. No tracheal deviation present.  Cardiovascular: Normal rate and regular rhythm.  Respiratory: Effort normal and breath sounds normal.  GI: Soft. Bowel sounds are normal. She exhibits no distension. There is no abdominal tenderness.  Musculoskeletal: Normal  range of motion.        General: No tenderness.  Neurological: She is alert.  Generalized weakness  Skin: Skin is warm and dry.   LABORATORY PANEL:  Female CBC Recent Labs  Lab 07/02/18 0616  WBC 7.7  HGB 7.9*  HCT 24.5*  PLT 105*   ------------------------------------------------------------------------------------------------------------------ Chemistries  Recent Labs  Lab 06/29/18 1450  07/02/18 0616  NA 140   < > 145  K 5.1   < > 3.5  CL 112*   < > 120*  CO2 23   < > 21*  GLUCOSE 120*   < > 91  BUN 36*   < > 26*  CREATININE 0.67   < > 0.59  CALCIUM 8.2*   < > 7.9*  MG  --    < > 1.9  AST 17  --   --   ALT <5  --   --   ALKPHOS 34*  --   --   BILITOT 0.6  --   --    < > = values in this interval not displayed.   RADIOLOGY:  No results found. ASSESSMENT AND PLAN:   1.GI bleed with acute blood loss anemia.  No further bleeding this morning. Status post 4 units packed RBC transfusion and 1 unit of 1 FFP Hemodynamically stable this morning.  Recent hemoglobin of 7.9 Patient reported to have had a positive bleeding scan.  Seen by interventional radiologist for emergent angiography with possible embolization.  There was no evidence of active bleeding.  Not able to find any bleeding source to embolize.  Patient had an abdominal ultrasound done .  Liver  and spleen normal.  Gallbladder not visualized.  Simple cyst in both kidneys. Patient seen by gastroenterologist.  Plan was for possible colonoscopy today which had to be canceled because patient was not able to do tolerate bowel prep Follow-up with GI reevaluation and recommendations CBC in a.m.  2.  History of dementia without behavioral disturbance Stable  3.  Hypothyroidism TSH level normal  4.  Hypokalemia Replaced.   DVT prophylaxis; SCDs No heparin products due to GI bleed   CODE STATUS: Full Code  TOTAL TIME TAKING CARE OF THIS PATIENT: 35 minutes.   More than 50% of the time was spent in  counseling/coordination of care: YES  POSSIBLE D/C IN 1 DAY, DEPENDING ON CLINICAL CONDITION.  To coordinate with case manager regarding discharge plans once patient out of ICU.  Patient in the [facility] program   Gerty.D on 07/02/2018 at 3:11 PM  Between 7am to 6pm - Pager - [phone]  After 6pm go to [email or web address] - Proofreader  [employer] [facility]  Office  [phone]  CC: Primary care physician; Gareth Morgan, MD  Note: This dictation was prepared with Dragon dictation along with smaller phrase technology. Any transcriptional errors that result from this process are unintentional.

## 2018-07-02 NOTE — Progress Notes (Signed)
The patient refused to drink her schedule golytely last night per nigth shift RN, is there any other way to do a prep.

## 2018-07-02 NOTE — Progress Notes (Signed)
Dr. Marius Ditch and Dr.Ojie messaged.The patient refused to drink golytely as order last night per night shift RN, is there any other way you want her to be prep?  Endo to be notified as well.

## 2018-07-02 NOTE — Progress Notes (Signed)
Cephas Darby, MD [address]  [place]  [place], [place] [address]  Main: [phone]  Fax: [phone] Pager: [phone]   Subjective: No acute events overnight, no further episodes of bleeding reported.  Patient did not drink GoLYTELY last night.  Her hemoglobin is stable in last 24 hours.  Objective: Vital signs in last 24 hours: Vitals:   07/01/18 2311 07/02/18 0445 07/02/18 0500 07/02/18 1211  BP: 112/62 120/69  (!) 98/58  Pulse: 72 68  73  Resp: (!) 24 (!) 24  20  Temp: 98.5 F (36.9 C) 98.6 F (37 C)  (!) 97.4 F (36.3 C)  TempSrc: Oral Oral  Oral  SpO2: 100% 100%  99%  Weight:   77.6 kg   Height:       Weight change: 1.81 kg  Intake/Output Summary (Last 24 hours) at 07/02/2018 1820 Last data filed at 07/02/2018 1700 Gross per 24 hour  Intake 1222.09 ml  Output 201 ml  Net 1021.09 ml     Exam: Heart:: Regular rate and rhythm or S1S2 present Lungs: normal and clear to auscultation Abdomen: soft, nontender, normal bowel sounds   Lab Results: CBC Latest Ref Rng & Units 07/02/2018 07/01/2018 06/30/2018  WBC 4.0 - 10.5 K/uL 7.7 9.7 11.5(H)  Hemoglobin 12.0 - 15.0 g/dL 7.9(L) 8.0(L) 7.3(L)  Hematocrit 36.0 - 46.0 % 24.5(L) 24.6(L) 22.4(L)  Platelets 150 - 400 K/uL 105(L) 98(L) 108(L)   CMP Latest Ref Rng & Units 07/02/2018 07/01/2018 06/30/2018  Glucose 70 - 99 mg/dL 91 103(H) 130(H)  BUN 8 - 23 mg/dL 26(H) 26(H) 33(H)  Creatinine 0.44 - 1.00 mg/dL 0.59 0.60 0.62  Sodium 135 - 145 mmol/L 145 143 143  Potassium 3.5 - 5.1 mmol/L 3.5 3.2(L) 3.6  Chloride 98 - 111 mmol/L 120(H) 119(H) 118(H)  CO2 22 - 32 mmol/L 21(L) 22 20(L)  Calcium 8.9 - 10.3 mg/dL 7.9(L) 7.7(L) 7.7(L)  Total Protein 6.5 - 8.1 g/dL - - -  Total Bilirubin 0.3 - 1.2 mg/dL - - -  Alkaline Phos 38 - 126 U/L - - -  AST 15 - 41 U/L - - -  ALT 0 - 44 U/L - - -   Micro Results: Recent Results (from the past 240 hour(s))  Culture, blood (Routine x 2)     Status: None (Preliminary result)     Collection Time: 06/29/18  2:50 PM  Result Value Ref Range Status   Specimen Description BLOOD BLOOD LEFT HAND  Final   Special Requests   Final    BOTTLES DRAWN AEROBIC AND ANAEROBIC Blood Culture adequate volume   Culture   Final    NO GROWTH 3 DAYS Performed at [facility], [address] [place], [place] [address]    Report Status PENDING  Incomplete  MRSA PCR Screening     Status: None   Collection Time: 06/29/18  6:36 PM  Result Value Ref Range Status   MRSA by PCR NEGATIVE NEGATIVE Final    Comment:        The GeneXpert MRSA Assay (FDA approved for NASAL specimens only), is one component of a comprehensive MRSA colonization surveillance program. It is not intended to diagnose MRSA infection nor to guide or monitor treatment for MRSA infections. Performed at [facility], [address], [place], [place] [address]    Studies/Results: US Abdomen Complete  Result Date: 07/01/2018 CLINICAL DATA:  Anemia.  Low platelet count. EXAM: ABDOMEN ULTRASOUND COMPLETE COMPARISON:  No recent  prior. FINDINGS: Gallbladder: Gallbladder was not visualized. Common bile duct: Diameter: 3 mm Liver: No focal lesion identified. Within normal limits in parenchymal echogenicity. Portal vein is patent on color Doppler imaging with normal direction of blood flow towards the liver. IVC: No abnormality visualized. Pancreas: Visualized portion unremarkable. Spleen: Size and appearance within normal limits. Right Kidney: Length: 10.2 cm. Echogenicity within normal limits. 0.9 cm simple cyst. No hydronephrosis visualized. Left Kidney: Length: 9.0 cm. Echogenicity within normal limits. 3.5 cm simple cyst. No hydronephrosis visualized. Abdominal aorta: No aneurysm visualized. Other findings: None. IMPRESSION: 1.  Liver and spleen appear normal. 2.  Gallbladder not visualized.  No biliary distention. 3.  Simple cyst both kidneys. Electronically Signed   By: Marcello Moores  Register   On: 07/01/2018  12:51   Medications:  I have reviewed the patient's current medications. Prior to Admission:  Medications Prior to Admission  Medication Sig Dispense Refill Last Dose  . acetaminophen (TYLENOL) 325 MG tablet Take 650 mg by mouth every 6 (six) hours as needed.   prn at prn  . acetaminophen (TYLENOL) 650 MG CR tablet Take 650 mg by mouth every 8 (eight) hours as needed for pain.   06/29/2018 at 0909  . carbidopa-levodopa (SINEMET IR) 25-100 MG tablet Take 2 tablets by mouth 3 (three) times daily.   06/29/2018 at 0909  . cholecalciferol (VITAMIN D) 25 MCG (1000 UT) tablet Take 1,000 Units by mouth daily.   06/29/2018 at 0909  . guaiFENesin-dextromethorphan (ROBITUSSIN DM) 100-10 MG/5ML syrup Take 5 mLs by mouth every 4 (four) hours as needed for cough.   prn at prn  . latanoprost (XALATAN) 0.005 % ophthalmic solution Place 1 drop into both eyes daily at 6 (six) AM.   06/28/2018 at 2328  . mineral oil-hydrophilic petrolatum (AQUAPHOR) ointment Apply 1 application topically Nightly.   06/29/2018 at 0145  . naloxone [facility]) nasal spray 4 mg/0.1 mL Place 1 spray into the nose.   prn at prn  . senna (SENOKOT) 8.6 MG TABS tablet Take 8.6 mg by mouth 2 (two) times daily as needed for mild constipation.   06/29/2018 at 0909   Scheduled: . sodium chloride   Intravenous Once  . latanoprost  1 drop Both Eyes QHS  . mouth rinse  15 mL Mouth Rinse BID  . [START ON 07/03/2018] pantoprazole  40 mg Intravenous Q12H  . sodium chloride flush  3 mL Intravenous Q12H   Continuous: . norepinephrine (LEVOPHED) Adult infusion Stopped (06/30/18 0323)  . sodium chloride Stopped (06/30/18 0700)  . sodium chloride     RXV:[id number] **OR** acetaminophen, albuterol, ondansetron **OR** ondansetron (ZOFRAN) IV Scheduled Meds: . sodium chloride   Intravenous Once  . latanoprost  1 drop Both Eyes QHS  . mouth rinse  15 mL Mouth Rinse BID  . [START ON 07/03/2018] pantoprazole  40 mg Intravenous Q12H  . sodium chloride flush  3 mL  Intravenous Q12H   Continuous Infusions: . norepinephrine (LEVOPHED) Adult infusion Stopped (06/30/18 0323)  . sodium chloride Stopped (06/30/18 0700)  . sodium chloride     PRN Meds:.acetaminophen **OR** acetaminophen, albuterol, ondansetron **OR** ondansetron (ZOFRAN) IV   Assessment: Active Problems:   GI bleed   Acute blood loss anemia   Hypotension  Hemoglobin is relatively stable in last 24 hours with no further episodes of bleeding.  Plan: We will try MiraLAX prep  Tentative plan for EGD and colonoscopy tomorrow.   Monitor CBC closely and transfuse if hemoglobin less than 7 Continue clear liquid  diet N.p.o. past midnight   LOS: 3 days   Jamee Keach 07/02/2018, 6:20 PM

## 2018-07-02 NOTE — Plan of Care (Signed)
Prep started for EGD and colonoscopy tomorrow. Magnesium citrate given.  Problem: Education: Goal: Ability to identify signs and symptoms of gastrointestinal bleeding will improve Outcome: Progressing   Problem: Bowel/Gastric: Goal: Will show no signs and symptoms of gastrointestinal bleeding Outcome: Progressing   Problem: Fluid Volume: Goal: Will show no signs and symptoms of excessive bleeding Outcome: Progressing   Problem: Clinical Measurements: Goal: Complications related to the disease process, condition or treatment will be avoided or minimized Outcome: Progressing   Problem: Education: Goal: Knowledge of General Education information will improve Description Including pain rating scale, medication(s)/side effects and non-pharmacologic comfort measures Outcome: Progressing   Problem: Pain Managment: Goal: General experience of comfort will improve Outcome: Progressing   Problem: Safety: Goal: Ability to remain free from injury will improve Outcome: Progressing

## 2018-07-03 ENCOUNTER — Encounter: Payer: Self-pay | Admitting: Gastroenterology

## 2018-07-03 ENCOUNTER — Inpatient Hospital Stay: Payer: Medicare (Managed Care) | Admitting: Registered Nurse

## 2018-07-03 ENCOUNTER — Encounter
Admission: EM | Disposition: A | Discharge status: Skilled Nursing Facility | Payer: Self-pay | Source: Skilled Nursing Facility | Attending: Internal Medicine

## 2018-07-03 HISTORY — PX: COLONOSCOPY WITH PROPOFOL: SHX5780

## 2018-07-03 HISTORY — PX: ESOPHAGOGASTRODUODENOSCOPY (EGD) WITH PROPOFOL: SHX5813

## 2018-07-03 LAB — CBC
HCT: 24.6 % — ABNORMAL LOW (ref 36.0–46.0)
HEMOGLOBIN: 7.9 g/dL — AB (ref 12.0–15.0)
MCH: 28.8 pg (ref 26.0–34.0)
MCHC: 32.1 g/dL (ref 30.0–36.0)
MCV: 89.8 fL (ref 80.0–100.0)
Platelets: 121 10*3/uL — ABNORMAL LOW (ref 150–400)
RBC: 2.74 MIL/uL — ABNORMAL LOW (ref 3.87–5.11)
RDW: 16.3 % — ABNORMAL HIGH (ref 11.5–15.5)
WBC: 7.4 10*3/uL (ref 4.0–10.5)
nRBC: 0 % (ref 0.0–0.2)

## 2018-07-03 LAB — TYPE AND SCREEN
ABO/RH(D): B POS
Antibody Screen: NEGATIVE
Unit division: 0
Unit division: 0
Unit division: 0
Unit division: 0
Unit division: 0
Unit division: 0
Unit division: 0
Unit division: 0
Unit division: 0
Unit division: 0
Unit division: 0

## 2018-07-03 LAB — BPAM RBC
BLOOD PRODUCT EXPIRATION DATE: 202001222359
BLOOD PRODUCT EXPIRATION DATE: 202001272359
Blood Product Expiration Date: 202001252359
Blood Product Expiration Date: 202001262359
Blood Product Expiration Date: 202001262359
Blood Product Expiration Date: 202001262359
Blood Product Expiration Date: 202001302359
Blood Product Expiration Date: 202001312359
Blood Product Expiration Date: 202001312359
Blood Product Expiration Date: 202002012359
Blood Product Expiration Date: 202002012359
ISSUE DATE / TIME: 202001051531
ISSUE DATE / TIME: 202001051531
ISSUE DATE / TIME: 202001051615
ISSUE DATE / TIME: 202001051615
ISSUE DATE / TIME: 202001070125
ISSUE DATE / TIME: 202001070914
ISSUE DATE / TIME: 202001080146
ISSUE DATE / TIME: 202001061103
UNIT TYPE AND RH: 1700
UNIT TYPE AND RH: 5100
UNIT TYPE AND RH: 7300
UNIT TYPE AND RH: 7300
Unit Type and Rh: 1700
Unit Type and Rh: 5100
Unit Type and Rh: 5100
Unit Type and Rh: 5100
Unit Type and Rh: 7300
Unit Type and Rh: 7300
Unit Type and Rh: 7300

## 2018-07-03 SURGERY — ESOPHAGOGASTRODUODENOSCOPY (EGD) WITH PROPOFOL
Anesthesia: General

## 2018-07-03 MED ORDER — PROPOFOL 10 MG/ML IV BOLUS
INTRAVENOUS | Status: DC | PRN
  Administered 2018-07-03: 40 mg via INTRAVENOUS

## 2018-07-03 MED ORDER — PROPOFOL 500 MG/50ML IV EMUL
INTRAVENOUS | Status: DC | PRN
  Administered 2018-07-03: 100 ug/kg/min via INTRAVENOUS

## 2018-07-03 MED ORDER — PHENYLEPHRINE HCL 10 MG/ML IJ SOLN
INTRAMUSCULAR | Status: DC | PRN
  Administered 2018-07-03: 200 ug via INTRAVENOUS

## 2018-07-03 MED ORDER — SODIUM CHLORIDE 0.9 % IV SOLN
510.0000 mg | Freq: Once | INTRAVENOUS | Status: AC
  Administered 2018-07-03: 510 mg via INTRAVENOUS
  Filled 2018-07-03: qty 17

## 2018-07-03 MED ORDER — SODIUM CHLORIDE 0.9 % IV SOLN
INTRAVENOUS | Status: DC
  Administered 2018-07-03 (×2): via INTRAVENOUS

## 2018-07-03 NOTE — OR Nursing (Signed)
York Cerise here to transport pt back to her room 225.

## 2018-07-03 NOTE — Op Note (Signed)
[facility] Patient Name: Frances Gardner Procedure Date: 07/03/2018 11:09 AM MRN: [id number] Account #: [id number] Date of Birth: 08/01/1926 Admit Type: Inpatient Age: 85 Room: [facility] ENDO ROOM 2 Gender: Female Note Status: Finalized Procedure:            Upper GI endoscopy Indications:          Acute post hemorrhagic anemia, Hematochezia Providers:            Lin Landsman MD, MD Referring MD:         Shelly Coss. Coralie Common (Referring MD) Medicines:            Monitored Anesthesia Care Complications:        No immediate complications. Estimated blood loss: None. Procedure:            Pre-Anesthesia Assessment:                       - Prior to the procedure, a History and Physical was                        performed, and patient medications and allergies were                        reviewed. The patient is unable to give consent                        secondary to the patient being legally incompetent to                        consent. The risks and benefits of the procedure and                        the sedation options and risks were discussed with the                        patient's guardian. All questions were answered and                        informed consent was obtained. Patient identification                        and proposed procedure were verified by the physician,                        the nurse, the anesthesiologist, the anesthetist and                        the technician in the pre-procedure area in the                        procedure room in the endoscopy suite. Mental Status                        Examination: dementia. Airway Examination: normal                        oropharyngeal airway and neck mobility. Respiratory                        Examination: clear to auscultation. CV Examination:  normal. Prophylactic Antibiotics: The patient does not                        require prophylactic antibiotics.  Prior Anticoagulants:                        The patient has taken no previous anticoagulant or                        antiplatelet agents. ASA Grade Assessment: III - A                        patient with severe systemic disease. After reviewing                        the risks and benefits, the patient was deemed in                        satisfactory condition to undergo the procedure. The                        anesthesia plan was to use monitored anesthesia care                        (MAC). Immediately prior to administration of                        medications, the patient was re-assessed for adequacy                        to receive sedatives. The heart rate, respiratory rate,                        oxygen saturations, blood pressure, adequacy of                        pulmonary ventilation, and response to care were                        monitored throughout the procedure. The physical status                        of the patient was re-assessed after the procedure.                       After obtaining informed consent, the endoscope was                        passed under direct vision. Throughout the procedure,                        the patient's blood pressure, pulse, and oxygen                        saturations were monitored continuously. The Endoscope                        was introduced through the mouth, and advanced to the  second part of duodenum. The upper GI endoscopy was                        accomplished without difficulty. The patient tolerated                        the procedure well. Findings:      The esophagus was normal.      The stomach was normal.      The examined duodenum was normal. Impression:           - Normal esophagus.                       - Normal stomach.                       - Normal examined duodenum.                       - No specimens collected. Recommendation:       - Return patient to hospital ward for  ongoing care.                       - Resume regular diet today.                       - Continue present medications.                       - Did not proceed with colonoscopy today due to poor                        prep for 2 days and her BMs are brown with no evidence                        of active GI bleed                       - No further endoscopic evaluation at this time                       - Discontinue protonix drip                       - Recommend palliative care consult Procedure Code(s):    --- Professional ---                       [phone], Esophagogastroduodenoscopy, flexible, transoral;                        diagnostic, including collection of specimen(s) by                        brushing or washing, when performed (separate procedure) Diagnosis Code(s):    --- Professional ---                       D62, Acute posthemorrhagic anemia CPT copyright 2018 American Medical Association. All rights reserved. The codes documented in this report are preliminary and upon coder review may  be revised to meet current compliance requirements. Dr. Ulyess Mort Lin Landsman MD, MD 07/03/2018 11:32:21 AM This  report has been signed electronically. Number of Addenda: 0 Note Initiated On: 07/03/2018 11:09 AM      [facility]

## 2018-07-03 NOTE — Transfer of Care (Signed)
Immediate Anesthesia Transfer of Care Note  Patient: Frances Gardner  Procedure(s) Performed: ESOPHAGOGASTRODUODENOSCOPY (EGD) WITH PROPOFOL (N/A ) COLONOSCOPY WITH PROPOFOL (N/A )  Patient Location: PACU  Anesthesia Type:General  Level of Consciousness: sedated  Airway & Oxygen Therapy: Patient Spontanous Breathing and Patient connected to nasal cannula oxygen  Post-op Assessment: Report given to RN and Post -op Vital signs reviewed and stable  Post vital signs: Reviewed and stable  Last Vitals:  Vitals Value Taken Time  BP 108/59 07/03/2018 11:38 AM  Temp 36.3 C 07/03/2018 11:38 AM  Pulse 73 07/03/2018 11:38 AM  Resp 18 07/03/2018 11:38 AM  SpO2 98 % 07/03/2018 11:38 AM    Last Pain:  Vitals:   07/03/18 1133  TempSrc: Tympanic  PainSc: Asleep         Complications: No apparent anesthesia complications

## 2018-07-03 NOTE — Anesthesia Preprocedure Evaluation (Signed)
Anesthesia Evaluation  Patient identified by MRN, date of birth, ID band Patient awake    Reviewed: Allergy & Precautions, H&P , NPO status , Patient's Chart, lab work & pertinent test results, reviewed documented beta blocker date and time   Airway Mallampati: II   Neck ROM: full    Dental  (+) Poor Dentition   Pulmonary neg pulmonary ROS,    Pulmonary exam normal        Cardiovascular negative cardio ROS Normal cardiovascular exam Rhythm:regular Rate:Normal     Neuro/Psych PSYCHIATRIC DISORDERS Dementia negative neurological ROS  negative psych ROS   GI/Hepatic negative GI ROS, Neg liver ROS,   Endo/Other  negative endocrine ROSHypothyroidism   Renal/GU negative Renal ROS  negative genitourinary   Musculoskeletal   Abdominal   Peds  Hematology negative hematology ROS (+) Blood dyscrasia, anemia ,   Anesthesia Other Findings Past Medical History: No date: Dementia ([place]) No date: Hypothyroidism No date: Parkinson's disease ([facility]) Past Surgical History: 06/30/2018: IR ANGIOGRAM VISCERAL SELECTIVE BMI    Body Mass Index:  56.88 kg/m     Reproductive/Obstetrics negative OB ROS                             Anesthesia Physical Anesthesia Plan  ASA: III  Anesthesia Plan: General   Post-op Pain Management:    Induction:   PONV Risk Score and Plan:   Airway Management Planned:   Additional Equipment:   Intra-op Plan:   Post-operative Plan:   Informed Consent: I have reviewed the patients History and Physical, chart, labs and discussed the procedure including the risks, benefits and alternatives for the proposed anesthesia with the patient or authorized representative who has indicated his/her understanding and acceptance.   Dental Advisory Given  Plan Discussed with: CRNA  Anesthesia Plan Comments:         Anesthesia Quick Evaluation

## 2018-07-03 NOTE — Discharge Summary (Signed)
[place] [facility] [place] NAME: Frances Gardner    MR#:  [id number]  DATE OF BIRTH:  January 25, 1927  DATE OF ADMISSION:  06/29/2018   ADMITTING PHYSICIAN: Hillary Bow, MD  DATE OF DISCHARGE: 07/03/2018  PRIMARY CARE PHYSICIAN: Gareth Morgan, MD   ADMISSION DIAGNOSIS:  Acute blood loss anemia [D62] Hypotension, unspecified hypotension type [I95.9] Altered mental status, unspecified altered mental status type [R41.82] Gastrointestinal hemorrhage, unspecified gastrointestinal hemorrhage type [K92.2] DISCHARGE DIAGNOSIS:  Active Problems:   GI bleed   Acute blood loss anemia   Hypotension  SECONDARY DIAGNOSIS:   Past Medical History:  Diagnosis Date  . Dementia ([place])   . Hypothyroidism   . Parkinson's disease [facility])    HOSPITAL COURSE:  Chief complaint; severe rectal bleed  HPI Frances Gardner  is a 85 y.o. female with a known history of Parkinson's disease, hypothyroidism who presented from local nursing home with severe rectal bleeding.  Patient was found to be hypotensive with systolic blood pressure in the 70s.  Massive blood transfusion protocol initiated and she received 4 units of packed RBC and is about to receive 1 unit of FFP and presently blood pressure has improved to 92/62.  Patient has a most form with full CODE STATUS.  Unable to contact any family.  Not on any anticoagulants. Patient is being admitted for severe GI bleed thought to be lower.  Bleeding scan ordered. Patient is drowsy and confused and unable to give any history. Continued to have rectal bleeding and was subsequently admitted to the intensive care unit.  Please refer to the H&P dictated for further details.   HOSPITAL COURSE: 1.GI bleed with acute blood loss anemia.  No further bleeding .Patient status post 4 units packed RBC transfusion and 1 unit of1 FFP. Hemodynamically stable this morning.  Recent hemoglobin of 7.9. Patient reported to have had a positive  bleeding scan during this admission. Seen by interventional radiologist for emergent angiography with possible embolization.  There was no evidence of active bleeding.  Not able to find any bleeding source to embolize.  Patient had an abdominal ultrasound done .  Liver and spleen normal.  Gallbladder not visualized.  Simple cyst in both kidneys. Patient seen by gastroenterologist. Patient had EGD done today which was normal.  Recommendation was to resume regular diet.  Colonoscopy was not done today due to poor prep for the last 2 days.  Patient has bowel movements with brown with no evidence of active GI bleed.  Discontinued Protonix drip.Gastroenterology service has signed off and recommended palliative care consult. I was given a message from patient's primary care physician Dr. Demetrio Lapping with the [facility] program who requested for me to call him and was asking about having patient discharged as soon as possible.  I called and discussed case with him and he confirmed they have palliative care team and would prefer to have patient discharged back to the skilled nursing facility.  He will make arrangements to have palliative care team evaluate patient at the nursing facility and this should not hold discharge plans for today.  Patient remains hemodynamically stable with no further bleeding.  2.  History of dementia without behavioral disturbance Stable  3.  Hypothyroidism TSH level normal  4.  Hypokalemia Replaced.   5.  History of Parkinson's disease Patient is bedbound at baseline.  Disposition; patient being discharged back to skilled nursing facility today. CODE STATUS: Full Code  DISCHARGE CONDITIONS:  Stable CONSULTS OBTAINED:  Treatment Team:  Lin Landsman, MD DRUG ALLERGIES:   Allergies  Allergen Reactions  . Penicillins   . Shellfish-Derived Products    DISCHARGE MEDICATIONS:   Allergies as of 07/03/2018      Reactions   Penicillins    Shellfish-derived Products         Medication List    TAKE these medications   acetaminophen 650 MG CR tablet Commonly known as:  TYLENOL Take 650 mg by mouth every 8 (eight) hours as needed for pain.   acetaminophen 325 MG tablet Commonly known as:  TYLENOL Take 650 mg by mouth every 6 (six) hours as needed.   carbidopa-levodopa 25-100 MG tablet Commonly known as:  SINEMET IR Take 2 tablets by mouth 3 (three) times daily.   cholecalciferol 25 MCG (1000 UT) tablet Commonly known as:  VITAMIN D Take 1,000 Units by mouth daily.   guaiFENesin-dextromethorphan 100-10 MG/5ML syrup Commonly known as:  ROBITUSSIN DM Take 5 mLs by mouth every 4 (four) hours as needed for cough.   mineral oil-hydrophilic petrolatum ointment Apply 1 application topically Nightly.   NARCAN 4 MG/0.1ML Liqd nasal spray kit Generic drug:  naloxone Place 1 spray into the nose.   senna 8.6 MG Tabs tablet Commonly known as:  SENOKOT Take 8.6 mg by mouth 2 (two) times daily as needed for mild constipation.   XALATAN 0.005 % ophthalmic solution Generic drug:  latanoprost Place 1 drop into both eyes daily at 6 (six) AM.        DISCHARGE INSTRUCTIONS:   DIET:  Cardiac diet DISCHARGE CONDITION:  Stable ACTIVITY:  Activity as tolerated OXYGEN:  Home Oxygen: No.  Oxygen Delivery: room air DISCHARGE LOCATION:  nursing home   If you experience worsening of your admission symptoms, develop shortness of breath, life threatening emergency, suicidal or homicidal thoughts you must seek medical attention immediately by calling 911 or calling your MD immediately  if symptoms less severe.  You Must read complete instructions/literature along with all the possible adverse reactions/side effects for all the Medicines you take and that have been prescribed to you. Take any new Medicines after you have completely understood and accpet all the possible adverse reactions/side effects.   Please note  You were cared for by a hospitalist  during your hospital stay. If you have any questions about your discharge medications or the care you received while you were in the hospital after you are discharged, you can call the unit and asked to speak with the hospitalist on call if the hospitalist that took care of you is not available. Once you are discharged, your primary care physician will handle any further medical issues. Please note that NO REFILLS for any discharge medications will be authorized once you are discharged, as it is imperative that you return to your primary care physician (or establish a relationship with a primary care physician if you do not have one) for your aftercare needs so that they can reassess your need for medications and monitor your lab values.    On the day of Discharge:  VITAL SIGNS:  Blood pressure (!) 101/59, pulse 69, temperature (!) 97.3 F (36.3 C), resp. rate 15, height 5' 8"  (1.727 m), weight (!) 169.7 kg, SpO2 96 %. PHYSICAL EXAMINATION:  GENERAL:  85 y.o.-year-old patient lying in the bed with no acute distress.  EYES: Pupils equal, round, reactive to light and accommodation. No scleral icterus. Extraocular muscles intact.  HEENT: Head atraumatic, normocephalic. Oropharynx and nasopharynx clear.  NECK:  Supple, no jugular venous distention. No thyroid enlargement, no tenderness.  LUNGS: Normal breath sounds bilaterally, no wheezing, rales,rhonchi or crepitation. No use of accessory muscles of respiration.  CARDIOVASCULAR: S1, S2 normal. No murmurs, rubs, or gallops.  ABDOMEN: Soft, non-tender, non-distended. Bowel sounds present. No organomegaly or mass.  EXTREMITIES: No pedal edema, cyanosis, or clubbing.  NEUROLOGIC: Cranial nerves II through XII are intact. Muscle strength 5/5 in all extremities. Sensation intact. Gait not checked.  PSYCHIATRIC: The patient is alert and oriented x 3.  SKIN: No obvious rash, lesion, or ulcer.  DATA REVIEW:   CBC Recent Labs  Lab 07/03/18 0526  WBC 7.4   HGB 7.9*  HCT 24.6*  PLT 121*    Chemistries  Recent Labs  Lab 06/29/18 1450  07/02/18 0616  NA 140   < > 145  K 5.1   < > 3.5  CL 112*   < > 120*  CO2 23   < > 21*  GLUCOSE 120*   < > 91  BUN 36*   < > 26*  CREATININE 0.67   < > 0.59  CALCIUM 8.2*   < > 7.9*  MG  --    < > 1.9  AST 17  --   --   ALT <5  --   --   ALKPHOS 34*  --   --   BILITOT 0.6  --   --    < > = values in this interval not displayed.     Microbiology Results  Results for orders placed or performed during the hospital encounter of 06/29/18  Culture, blood (Routine x 2)     Status: None (Preliminary result)   Collection Time: 06/29/18  2:50 PM  Result Value Ref Range Status   Specimen Description BLOOD BLOOD LEFT HAND  Final   Special Requests   Final    BOTTLES DRAWN AEROBIC AND ANAEROBIC Blood Culture adequate volume   Culture   Final    NO GROWTH 4 DAYS Performed at [facility], [address] [place], [place] [address]    Report Status PENDING  Incomplete  MRSA PCR Screening     Status: None   Collection Time: 06/29/18  6:36 PM  Result Value Ref Range Status   MRSA by PCR NEGATIVE NEGATIVE Final    Comment:        The GeneXpert MRSA Assay (FDA approved for NASAL specimens only), is one component of a comprehensive MRSA colonization surveillance program. It is not intended to diagnose MRSA infection nor to guide or monitor treatment for MRSA infections. Performed at [facility], [address], [place], [place] [address]     RADIOLOGY:  No results found.   Management plans discussed with the patient, family and they are in agreement.  CODE STATUS: Full Code   TOTAL TIME TAKING CARE OF THIS PATIENT: 48 minutes.    Arijana Narayan M.D on 07/03/2018 at 1:58 PM  Between 7am to 6pm - Pager - [phone]  After 6pm go to [email or web address] - Proofreader  [facility]  Office  [phone]  CC: Primary care physician; Gareth Morgan, MD   Note: This dictation was prepared with Dragon dictation along with smaller phrase technology. Any transcriptional errors that result from this process are unintentional.

## 2018-07-03 NOTE — Anesthesia Post-op Follow-up Note (Signed)
Anesthesia QCDR form completed.        

## 2018-07-03 NOTE — Clinical Social Work Note (Signed)
Patient discharging to return [facility] today due to PACE physician: Dr. Meredith Staggers asking the attending to discharge patient and they will have palliative follow up with patient. Hilda Blades at [facility] is aware of discharge and discharge information has been sent. Nurse to call report and is calling patient's son, Fannie Knee, to notify of discharge. Shela Leff MSW,LCSW [phone]

## 2018-07-03 NOTE — Progress Notes (Signed)
EGD postprocedure note  - Normal EGD - Resume regular diet today. - Continue present medications. - Did not proceed with colonoscopy today due to poor prep for 2 days and her BMs are brown with no evidence of active GI bleed -Do not recommend further endoscopic evaluation at this time - Discontinue protonix drip - Recommend palliative care consult - Ordered for Feraheme today  GI will sign off, please call us back with questions or concerns  Cephas Darby, MD [place]  [place], [place] [address]  Main: [phone]  Fax: [phone] Pager: [phone]

## 2018-07-03 NOTE — Progress Notes (Signed)
Patient discharging back to [facility]. Called report and spoke to Murray. Attempted to notify POA Jamal, no answer. Spoke with [place] [facility] and successfully notified family that patient is being discharged. Central line removed. Patient awaiting pick-up by [facility] representative.

## 2018-07-03 NOTE — Anesthesia Postprocedure Evaluation (Signed)
Anesthesia Post Note  Patient: Frances Gardner  Procedure(s) Performed: ESOPHAGOGASTRODUODENOSCOPY (EGD) WITH PROPOFOL (N/A ) COLONOSCOPY WITH PROPOFOL (N/A )  Patient location during evaluation: PACU Anesthesia Type: General Level of consciousness: awake and alert Pain management: pain level controlled Vital Signs Assessment: post-procedure vital signs reviewed and stable Respiratory status: spontaneous breathing, nonlabored ventilation, respiratory function stable and patient connected to nasal cannula oxygen Cardiovascular status: blood pressure returned to baseline and stable Postop Assessment: no apparent nausea or vomiting Anesthetic complications: no     Last Vitals:  Vitals:   07/03/18 1203 07/03/18 1213  BP: 107/60 (!) 101/59  Pulse: 73 69  Resp: 18 15  Temp:    SpO2: 97% 96%    Last Pain:  Vitals:   07/03/18 1213  TempSrc:   PainSc: 0-No pain                 Molli Barrows

## 2018-07-03 NOTE — Progress Notes (Addendum)
[place] at [place] NAME: Frances Gardner    MR#:  [id number]  DATE OF BIRTH:  09/22/1926  SUBJECTIVE:  CHIEF COMPLAINT:   Chief Complaint  Patient presents with  . Altered Mental Status   No new complaint this morning.  No further bleeding reported.  REVIEW OF SYSTEMS:  Review of Systems  Constitutional: Negative for chills and fever.  HENT: Negative for hearing loss and tinnitus.   Eyes: Negative for blurred vision and double vision.  Respiratory: Negative for cough and hemoptysis.   Cardiovascular: Negative for chest pain and palpitations.  Gastrointestinal: Negative for blood in stool, heartburn and nausea.       No further bleeding reported overnight  Genitourinary: Negative for dysuria and urgency.  Musculoskeletal: Negative for myalgias.  Skin: Negative for itching and rash.  Neurological: Negative for dizziness and headaches.  Psychiatric/Behavioral: Negative for depression and hallucinations.    DRUG ALLERGIES:   Allergies  Allergen Reactions  . Penicillins   . Shellfish-Derived Products    VITALS:  Blood pressure (!) 101/59, pulse 69, temperature (!) 97.3 F (36.3 C), resp. rate 15, height 5\' 8"  (1.727 m), weight (!) 169.7 kg, SpO2 96 %. PHYSICAL EXAMINATION:   Physical Exam  Constitutional: She appears well-developed and well-nourished.  HENT:  Head: Normocephalic and atraumatic.  Eyes: Pupils are equal, round, and reactive to light. Conjunctivae and EOM are normal.  Neck: Normal range of motion. Neck supple. No tracheal deviation present.  Cardiovascular: Normal rate and regular rhythm.  Respiratory: Effort normal and breath sounds normal.  GI: Soft. Bowel sounds are normal. She exhibits no distension. There is no abdominal tenderness.  Musculoskeletal: Normal range of motion.        General: No tenderness.  Neurological: She is alert.  Generalized weakness  Skin: Skin is warm and dry.   LABORATORY PANEL:   Female CBC Recent Labs  Lab 07/03/18 0526  WBC 7.4  HGB 7.9*  HCT 24.6*  PLT 121*   ------------------------------------------------------------------------------------------------------------------ Chemistries  Recent Labs  Lab 06/29/18 1450  07/02/18 0616  NA 140   < > 145  K 5.1   < > 3.5  CL 112*   < > 120*  CO2 23   < > 21*  GLUCOSE 120*   < > 91  BUN 36*   < > 26*  CREATININE 0.67   < > 0.59  CALCIUM 8.2*   < > 7.9*  MG  --    < > 1.9  AST 17  --   --   ALT <5  --   --   ALKPHOS 34*  --   --   BILITOT 0.6  --   --    < > = values in this interval not displayed.   RADIOLOGY:  No results found. ASSESSMENT AND PLAN:   1.GI bleed with acute blood loss anemia.  No further bleeding this morning. Status post 4 units packed RBC transfusion and 1 unit of 1 FFP Hemodynamically stable this morning.  Recent hemoglobin of 7.9 Patient reported to have had a positive bleeding scan.  Seen by interventional radiologist for emergent angiography with possible embolization.  There was no evidence of active bleeding.  Not able to find any bleeding source to embolize.  Patient had an abdominal ultrasound done .  Liver and spleen normal.  Gallbladder not visualized.  Simple cyst in both kidneys. Patient seen by gastroenterologist. Patient had EGD done today which was  normal.  Recommendation was to resume regular diet.  Colonoscopy was not done today due to poor prep for the last 2 days.  Patient has bowel movements with brown with no evidence of active GI bleed.  Discontinue Protonix drip. Gastroenterology service has signed off. Recommendation was for palliative care consult  2.  History of dementia without behavioral disturbance Stable  3.  Hypothyroidism TSH level normal  4.  Hypokalemia Replaced.   5.  History of Parkinson's disease Patient is bedbound at baseline.  DVT prophylaxis; SCDs No heparin products due to GI bleed   CODE STATUS: Full Code  TOTAL TIME  TAKING CARE OF THIS PATIENT: 33 minutes.   More than 50% of the time was spent in counseling/coordination of care: YES  POSSIBLE D/C IN 1 DAY, DEPENDING ON CLINICAL CONDITION.  To coordinate with case manager regarding discharge plans once patient out of ICU.  Patient in the [facility] program   [email or web address].D on 07/03/2018 at 1:13 PM  Between 7am to 6pm - Pager - [phone]  After 6pm go to [email or web address] - Proofreader  [facility]  Office  [phone]  CC: Primary care physician; Gareth Morgan, MD  Note: This dictation was prepared with Dragon dictation along with smaller phrase technology. Any transcriptional errors that result from this process are unintentional.

## 2018-07-04 LAB — CULTURE, BLOOD (ROUTINE X 2)
Culture: NO GROWTH
Special Requests: ADEQUATE

## 2018-07-09 ENCOUNTER — Encounter: Payer: Self-pay | Admitting: Gastroenterology

## 2018-08-19 ENCOUNTER — Ambulatory Visit: Payer: No Typology Code available for payment source | Admitting: Gastroenterology

## 2018-09-09 ENCOUNTER — Ambulatory Visit: Payer: No Typology Code available for payment source | Admitting: Gastroenterology

## 2019-07-27 ENCOUNTER — Inpatient Hospital Stay
Admission: EM | Admit: 2019-07-27 | Discharge: 2019-08-05 | DRG: 394 | Disposition: A | Discharge status: Skilled Nursing Facility | Payer: Medicare (Managed Care) | Source: Skilled Nursing Facility | Attending: Internal Medicine | Admitting: Internal Medicine

## 2019-07-27 ENCOUNTER — Encounter: Payer: Self-pay | Admitting: Emergency Medicine

## 2019-07-27 ENCOUNTER — Other Ambulatory Visit: Payer: Self-pay

## 2019-07-27 ENCOUNTER — Emergency Department: Payer: Medicare (Managed Care)

## 2019-07-27 DIAGNOSIS — F0280 Dementia in other diseases classified elsewhere without behavioral disturbance: Secondary | ICD-10-CM | POA: Diagnosis not present

## 2019-07-27 DIAGNOSIS — R58 Hemorrhage, not elsewhere classified: Secondary | ICD-10-CM

## 2019-07-27 DIAGNOSIS — I10 Essential (primary) hypertension: Secondary | ICD-10-CM | POA: Diagnosis present

## 2019-07-27 DIAGNOSIS — Z79891 Long term (current) use of opiate analgesic: Secondary | ICD-10-CM

## 2019-07-27 DIAGNOSIS — Z79899 Other long term (current) drug therapy: Secondary | ICD-10-CM

## 2019-07-27 DIAGNOSIS — R52 Pain, unspecified: Secondary | ICD-10-CM

## 2019-07-27 DIAGNOSIS — Z20822 Contact with and (suspected) exposure to covid-19: Secondary | ICD-10-CM | POA: Diagnosis present

## 2019-07-27 DIAGNOSIS — Z9049 Acquired absence of other specified parts of digestive tract: Secondary | ICD-10-CM

## 2019-07-27 DIAGNOSIS — Z66 Do not resuscitate: Secondary | ICD-10-CM | POA: Diagnosis present

## 2019-07-27 DIAGNOSIS — K921 Melena: Secondary | ICD-10-CM

## 2019-07-27 DIAGNOSIS — G20A1 Parkinson's disease without dyskinesia, without mention of fluctuations: Secondary | ICD-10-CM

## 2019-07-27 DIAGNOSIS — K922 Gastrointestinal hemorrhage, unspecified: Secondary | ICD-10-CM

## 2019-07-27 DIAGNOSIS — D129 Benign neoplasm of anus and anal canal: Secondary | ICD-10-CM | POA: Diagnosis not present

## 2019-07-27 DIAGNOSIS — E611 Iron deficiency: Secondary | ICD-10-CM | POA: Diagnosis present

## 2019-07-27 DIAGNOSIS — Z880 Allergy status to penicillin: Secondary | ICD-10-CM

## 2019-07-27 DIAGNOSIS — G20 Parkinson's disease: Secondary | ICD-10-CM

## 2019-07-27 DIAGNOSIS — E876 Hypokalemia: Secondary | ICD-10-CM | POA: Diagnosis not present

## 2019-07-27 DIAGNOSIS — D62 Acute posthemorrhagic anemia: Secondary | ICD-10-CM | POA: Diagnosis not present

## 2019-07-27 DIAGNOSIS — H409 Unspecified glaucoma: Secondary | ICD-10-CM | POA: Diagnosis present

## 2019-07-27 DIAGNOSIS — D649 Anemia, unspecified: Secondary | ICD-10-CM | POA: Diagnosis present

## 2019-07-27 DIAGNOSIS — Z7189 Other specified counseling: Secondary | ICD-10-CM

## 2019-07-27 DIAGNOSIS — Z515 Encounter for palliative care: Secondary | ICD-10-CM

## 2019-07-27 LAB — CBC WITH DIFFERENTIAL/PLATELET
Abs Immature Granulocytes: 0.01 10*3/uL (ref 0.00–0.07)
Basophils Absolute: 0 10*3/uL (ref 0.0–0.1)
Basophils Relative: 1 %
Eosinophils Absolute: 0.1 10*3/uL (ref 0.0–0.5)
Eosinophils Relative: 2 %
HCT: 28.7 % — ABNORMAL LOW (ref 36.0–46.0)
Hemoglobin: 8.6 g/dL — ABNORMAL LOW (ref 12.0–15.0)
Immature Granulocytes: 0 %
Lymphocytes Relative: 39 %
Lymphs Abs: 2 10*3/uL (ref 0.7–4.0)
MCH: 26.1 pg (ref 26.0–34.0)
MCHC: 30 g/dL (ref 30.0–36.0)
MCV: 87.2 fL (ref 80.0–100.0)
Monocytes Absolute: 0.3 10*3/uL (ref 0.1–1.0)
Monocytes Relative: 6 %
Neutro Abs: 2.6 10*3/uL (ref 1.7–7.7)
Neutrophils Relative %: 52 %
Platelets: 159 10*3/uL (ref 150–400)
RBC: 3.29 MIL/uL — ABNORMAL LOW (ref 3.87–5.11)
RDW: 14.6 % (ref 11.5–15.5)
WBC: 5 10*3/uL (ref 4.0–10.5)
nRBC: 0 % (ref 0.0–0.2)

## 2019-07-27 LAB — HEMOGLOBIN AND HEMATOCRIT, BLOOD
HCT: 27.2 % — ABNORMAL LOW (ref 36.0–46.0)
Hemoglobin: 8.2 g/dL — ABNORMAL LOW (ref 12.0–15.0)

## 2019-07-27 LAB — COMPREHENSIVE METABOLIC PANEL
ALT: <5 U/L (ref 0–44)
AST: 11 U/L — ABNORMAL LOW (ref 15–41)
Albumin: 3.1 g/dL — ABNORMAL LOW (ref 3.5–5.0)
Alkaline Phosphatase: 44 U/L (ref 38–126)
Anion gap: 6 (ref 5–15)
BUN: 24 mg/dL — ABNORMAL HIGH (ref 8–23)
CO2: 27 mmol/L (ref 22–32)
Calcium: 8.6 mg/dL — ABNORMAL LOW (ref 8.9–10.3)
Chloride: 108 mmol/L (ref 98–111)
Creatinine, Ser: 0.53 mg/dL (ref 0.44–1.00)
GFR calc Af Amer: >60 mL/min (ref >60)
GFR calc non Af Amer: >60 mL/min (ref >60)
Glucose, Bld: 108 mg/dL — ABNORMAL HIGH (ref 70–99)
Potassium: 4 mmol/L (ref 3.5–5.1)
Sodium: 141 mmol/L (ref 135–145)
Total Bilirubin: 0.4 mg/dL (ref 0.3–1.2)
Total Protein: 6.1 g/dL — ABNORMAL LOW (ref 6.5–8.1)

## 2019-07-27 LAB — PROTIME-INR
INR: 1.2 (ref 0.8–1.2)
Prothrombin Time: 14.7 seconds (ref 11.4–15.2)

## 2019-07-27 LAB — RESPIRATORY PANEL BY RT PCR (FLU A&B, COVID)
Influenza A by PCR: NEGATIVE
Influenza B by PCR: NEGATIVE
SARS Coronavirus 2 by RT PCR: NEGATIVE

## 2019-07-27 MED ORDER — SENNA 8.6 MG PO TABS: 8.6000 mg | ORAL_TABLET | Freq: Two times a day (BID) | ORAL | Status: DC | PRN

## 2019-07-27 MED ORDER — SODIUM CHLORIDE 0.9 % IV SOLN: Freq: Once | INTRAVENOUS | Status: AC

## 2019-07-27 MED ORDER — CARBIDOPA-LEVODOPA 25-100 MG PO TABS
2.0000 | ORAL_TABLET | Freq: Three times a day (TID) | ORAL | Status: DC
  Administered 2019-07-27 – 2019-08-05 (×22): 2 via ORAL
  Filled 2019-07-27 (×28): qty 2

## 2019-07-27 MED ORDER — POLYVINYL ALCOHOL 1.4 % OP SOLN
1.0000 [drp] | Freq: Two times a day (BID) | OPHTHALMIC | Status: DC | PRN
  Filled 2019-07-27: qty 15

## 2019-07-27 MED ORDER — MORPHINE SULFATE (CONCENTRATE) 10 MG/0.5ML PO SOLN: 4.0000 mg | ORAL | Status: DC | PRN

## 2019-07-27 MED ORDER — SODIUM CHLORIDE 0.9 % IV SOLN: INTRAVENOUS | Status: AC

## 2019-07-27 MED ORDER — ACETAMINOPHEN 325 MG PO TABS
650.0000 mg | ORAL_TABLET | Freq: Four times a day (QID) | ORAL | Status: DC | PRN
  Administered 2019-07-27 – 2019-08-05 (×3): 650 mg via ORAL
  Filled 2019-07-27 (×3): qty 2

## 2019-07-27 MED ORDER — LATANOPROST 0.005 % OP SOLN
1.0000 [drp] | Freq: Every day | OPHTHALMIC | Status: DC
  Administered 2019-07-27 – 2019-08-04 (×9): 1 [drp] via OPHTHALMIC
  Filled 2019-07-27: qty 2.5

## 2019-07-27 NOTE — ED Notes (Addendum)
Pt denies n/v/d and states she is only having pain in her knees. No blood is noted in the stool at this time but blood is noted upon rectal exam.

## 2019-07-27 NOTE — ED Provider Notes (Signed)
[facility] Emergency Department Provider Note  ____________________________________________   First MD Initiated Contact with Patient 07/27/19 1458     (approximate)  I have reviewed the triage vital signs and the nursing notes.   HISTORY  Chief Complaint Rectal Bleeding    HPI Frances Gardner is a 86 y.o. female  With h/o dementia, hypothyroidism here with bloody stools. History provided by history and SNF. Per report and review of records, pt has a h/o GI bleed in Jan 2020 for acute bleed requiring transfusion. Per report, pt was noticed to have dark blood in stool yesterday. Unclear if she's had any today. She states she feels like stomach is "a little swollen" but denies other complaints. No specific abd pain. No vomiting or diarrhea. No blood thinner use.   Level 5 caveat invoked as remainder of history, ROS, and physical exam limited due to patient's AMS.         Past Medical History:  Diagnosis Date  . Dementia ([place])   . Hypothyroidism   . Parkinson's disease [facility])     Patient Active Problem List   Diagnosis Date Noted  . Dementia due to Parkinson's disease without behavioral disturbance ([place]) 07/27/2019  . Parkinson disease ([place]) 07/27/2019  . Hematochezia 07/27/2019  . Acute blood loss anemia   . Hypotension   . GI bleed 06/29/2018    Past Surgical History:  Procedure Laterality Date  . COLONOSCOPY WITH PROPOFOL N/A 07/03/2018   Procedure: COLONOSCOPY WITH PROPOFOL;  Surgeon: Lin Landsman, MD;  Location: [facility] ENDOSCOPY;  Service: Gastroenterology;  Laterality: N/A;  . ESOPHAGOGASTRODUODENOSCOPY (EGD) WITH PROPOFOL N/A 07/03/2018   Procedure: ESOPHAGOGASTRODUODENOSCOPY (EGD) WITH PROPOFOL;  Surgeon: Lin Landsman, MD;  Location: [facility] ENDOSCOPY;  Service: Gastroenterology;  Laterality: N/A;  . IR ANGIOGRAM VISCERAL SELECTIVE  06/30/2018    Prior to Admission medications   Medication Sig Start Date End Date Taking? Authorizing  Provider  acetaminophen (TYLENOL) 650 MG CR tablet Take 650 mg by mouth every 8 (eight) hours as needed for pain.   Yes [provider]  carbidopa-levodopa (SINEMET IR) 25-100 MG tablet Take 2 tablets by mouth 3 (three) times daily. 10/17/11  Yes [provider]  cholecalciferol (VITAMIN D) 25 MCG (1000 UT) tablet Take 1,000 Units by mouth daily.   Yes [provider]  latanoprost (XALATAN) 0.005 % ophthalmic solution Place 1 drop into both eyes at bedtime.    Yes [provider]  loperamide (IMODIUM) 2 MG capsule Take 2-4 mg by mouth See admin instructions. Take 2 capsules (4mg ) by mouth after first loose stool and then take 1 capsule (2mg ) by mouth after each subsequent loose stool - max 8 capsules in 24 hours   Yes [provider]  mineral oil-hydrophilic petrolatum (AQUAPHOR) ointment Apply 1 application topically Nightly.   Yes [provider]  Morphine Sulfate (MORPHINE CONCENTRATE) 10 mg / 0.5 ml concentrated solution Take 4 mg by mouth every 2 (two) hours as needed for severe pain or shortness of breath.   Yes [provider]  Polyethyl Glycol-Propyl Glycol (SYSTANE) 0.4-0.3 % SOLN Place 1 drop into both eyes 2 (two) times daily as needed (dry eyes).   Yes [provider]  senna (SENOKOT) 8.6 MG TABS tablet Take 8.6 mg by mouth 2 (two) times daily as needed for mild constipation.   Yes [provider]  Zinc Oxide (DESITIN) 13 % CREA Apply 1 application topically 3 (three) times daily. (after toileting)   Yes  [provider]  acetaminophen (TYLENOL) 325 MG tablet Take 650 mg by mouth every 6 (six) hours as needed.    [provider]  guaiFENesin-dextromethorphan (ROBITUSSIN DM) 100-10 MG/5ML syrup Take 5 mLs by mouth every 4 (four) hours as needed for cough.    [provider]  naloxone [facility]) nasal spray 4 mg/0.1 mL Place 1 spray into the nose.    [provider]     Allergies Penicillins and Shellfish-derived products  History reviewed. No pertinent family history.  Social History Social History   Tobacco Use  . Smoking status: Unknown If Ever Smoked  . Smokeless tobacco: Never Used  Substance Use Topics  . Alcohol use: Not on file  . Drug use: Not on file    Review of Systems  Review of Systems  Unable to perform ROS: Dementia  Gastrointestinal: Positive for blood in stool.     ____________________________________________  PHYSICAL EXAM:      VITAL SIGNS: ED Triage Vitals  Enc Vitals Group     BP 07/27/19 1449 140/85     Pulse Rate 07/27/19 1449 71     Resp 07/27/19 1449 20     Temp 07/27/19 1449 98.3 F (36.8 C)     Temp Source 07/27/19 1449 Oral     SpO2 07/27/19 1449 100 %     Weight 07/27/19 1448 (!) 372 lb 9.2 oz (169 kg)     Height 07/27/19 1448 5\' 8"  (1.727 m)     Head Circumference --      Peak Flow --      Pain Score 07/27/19 1445 5     Pain Loc --      Pain Edu? --      Excl. in [place]? --      Physical Exam Vitals and nursing note reviewed.  Constitutional:      General: She is not in acute distress.    Appearance: She is well-developed.  HENT:     Head: Normocephalic and atraumatic.     Nose: Congestion present.  Eyes:     Conjunctiva/sclera: Conjunctivae normal.  Cardiovascular:     Rate and Rhythm: Normal rate and regular rhythm.     Heart sounds: Normal heart sounds.  Pulmonary:     Effort: Pulmonary effort is normal. No respiratory distress.     Breath sounds: No wheezing.  Abdominal:     General: Abdomen is flat. There is no distension.     Tenderness: There is no abdominal tenderness.     Comments: Soft, non-tender, no rebound or guarding  Genitourinary:    Comments: Grossly blood, dark red stool noted in rectal vault. No bright red or apparent active bleeding.  Musculoskeletal:     Cervical back: Neck supple.  Skin:    General: Skin is warm.     Capillary Refill: Capillary refill  takes less than 2 seconds.     Findings: No rash.  Neurological:     Mental Status: She is alert.     Motor: No abnormal muscle tone.     Comments: Oriented to person but not place or time, which is baseline per records. Follows commands, however, and is able to follow conversation appropriately       ____________________________________________   LABS (all labs ordered are listed, but only abnormal results are displayed)  Labs Reviewed  CBC WITH DIFFERENTIAL/PLATELET - Abnormal; Notable for the following components:      Result Value   RBC 3.29 (*)  Hemoglobin 8.6 (*)    HCT 28.7 (*)    All other components within normal limits  COMPREHENSIVE METABOLIC PANEL - Abnormal; Notable for the following components:   Glucose, Bld 108 (*)    BUN 24 (*)    Calcium 8.6 (*)    Total Protein 6.1 (*)    Albumin 3.1 (*)    AST 11 (*)    All other components within normal limits  RESPIRATORY PANEL BY RT PCR (FLU A&B, COVID)  PROTIME-INR  TYPE AND SCREEN  TYPE AND SCREEN    ____________________________________________  EKG: Normal sinus rhythn, VR 65. QRS 92, QTc 422. No acute St elevations or depressions. No ischemia or  Infarct. ________________________________________  RADIOLOGY All imaging, including plain films, CT scans, and ultrasounds, independently reviewed by me, and interpretations confirmed via formal radiology reads.  ED MD interpretation:   AAS: Neg for free air or acute surgical abnormality; +constipation  Official radiology report(s): DG Abd 1 View  Result Date: 07/27/2019 CLINICAL DATA:  Rectal bleeding. EXAM: ABDOMEN - 1 VIEW COMPARISON:  None. FINDINGS: The bowel gas pattern is normal. Moderate to marked bowel content is identified throughout colon. Degenerative joint changes of the spine are identified. Prior cholecystectomy clips are noted. IMPRESSION: No bowel obstruction. Moderate to marked bowel content identified throughout colon. This can be seen in  constipation. Electronically Signed   By: Abelardo Diesel M.D.   On: 07/27/2019 16:46    ____________________________________________  PROCEDURES   Procedure(s) performed (including Critical Care):  Procedures  ____________________________________________  INITIAL IMPRESSION / MDM / [place] / ED COURSE  As part of my medical decision making, I reviewed the following data within the [place] notes reviewed and incorporated, Old chart reviewed, Notes from prior ED visits, and [place] Controlled Substance [place] was evaluated in Emergency Department on 07/27/2019 for the symptoms described in the history of present illness. She was evaluated in the context of the global COVID-19 pandemic, which necessitated consideration that the patient might be at risk for infection with the SARS-CoV-2 virus that causes COVID-19. Institutional protocols and algorithms that pertain to the evaluation of patients at risk for COVID-19 are in a state of rapid change based on information released by regulatory bodies including the CDC and federal and state organizations. These policies and algorithms were followed during the patient's care in the ED.  Some ED evaluations and interventions may be delayed as a result of limited staffing during the pandemic.*     Medical Decision Making:  86 yo F here with recurrent suspected LGIB. Dark red stool noted on exam, but pt is HDS with stable Hgb from baseline. Will T&S. Discussed with Dr. Marius Ditch - will admit for pt to be seen by GI in AM. If pt has episode of significant bleeding here, consider NM Gi scan.   ____________________________________________  FINAL CLINICAL IMPRESSION(S) / ED DIAGNOSES  Final diagnoses:  Lower GI bleed     MEDICATIONS GIVEN DURING THIS VISIT:  Medications  acetaminophen (TYLENOL) tablet 650 mg (has no administration in time range)  morphine CONCENTRATE 10 mg / 0.5 ml oral  solution 4 mg (has no administration in time range)  senna (SENOKOT) tablet 8.6 mg (has no administration in time range)  carbidopa-levodopa (SINEMET IR) 25-100 MG per tablet immediate release 2 tablet (has no administration in time range)  latanoprost (XALATAN) 0.005 % ophthalmic solution 1 drop (has no administration in time range)  Polyethyl Glycol-Propyl Glycol 0.4-0.3 % SOLN 1 drop (has no administration in time range)  0.9 %  sodium chloride infusion (has no administration in time range)  0.9 %  sodium chloride infusion ( Intravenous New Bag/Given 07/27/19 1620)     ED Discharge Orders    None       Note:  This document was prepared using Dragon voice recognition software and may include unintentional dictation errors.   Duffy Bruce, MD 07/27/19 [phone]

## 2019-07-27 NOTE — H&P (Signed)
History and Physical    Frances Gardner [id number] DOB: 1927-02-22 DOA: 07/27/2019  PCP: Gareth Morgan, MD (Confirm with patient/family/NH records and if not entered, this has to be entered at [facility] point of entry) Patient coming from: from SNF  I have personally briefly reviewed patient's old medical records in [address]  Chief Complaint: hematochezia  HPI: Frances Gardner is a 86 y.o. female with medical history significant of Parkinson's disease with related dementia, chronic anemia with recorded HGB of 7.9.  Admitted Jan 2020 for GI bleed-negative EGD, did not have colonoscopy, + bleeding scan but bleeding stopped before any procedure. Diagnosis - presumed diverticular bleed. Patient was report by SNF staff to have had blood per rectum. She was referred to [facility] for further evaluation. (For level 3, the HPI must include 4+ descriptors: Location, Quality, Severity, Duration, Timing, Context, modifying factors, associated signs/symptoms and/or status of 3+ chronic problems.)  (Please avoid self-populating past medical history here) (The initial 2-3 lines should be focused and good to copy and paste in the HPI section of the daily progress note).  ED Course: Hemodynamically stable. Lab normal with a Hgb of 8.6. On exam maroon stool in the rectal vault. Dr. Marius Ditch, GI, was consulted who recommended observation admission by medical service and she will see patient in AM.   Review of Systems: As per HPI otherwise 10 point review of systems negative.  Unacceptable ROS statements: "10 systems reviewed," "Extensive" (without elaboration).  Acceptable ROS statements: "All others negative," "All others reviewed and are negative," and "All others unremarkable," with at [place] documented Can't double dip - if using for HPI can't use for ROS  Past Medical History:  Diagnosis Date  . Dementia ([place])   . Hypothyroidism   . Parkinson's disease [facility])     Past Surgical History:  Procedure  Laterality Date  . COLONOSCOPY WITH PROPOFOL N/A 07/03/2018   Procedure: COLONOSCOPY WITH PROPOFOL;  Surgeon: Lin Landsman, MD;  Location: [facility] ENDOSCOPY;  Service: Gastroenterology;  Laterality: N/A;  . ESOPHAGOGASTRODUODENOSCOPY (EGD) WITH PROPOFOL N/A 07/03/2018   Procedure: ESOPHAGOGASTRODUODENOSCOPY (EGD) WITH PROPOFOL;  Surgeon: Lin Landsman, MD;  Location: [facility] ENDOSCOPY;  Service: Gastroenterology;  Laterality: N/A;  . IR ANGIOGRAM VISCERAL SELECTIVE  06/30/2018  Soc Hx - never married. One child. Two grandchildren, 1 great-grand. Worked as a Surveyor, minerals. Lives in long-term care and is followed by [facility].   has an unknown smoking status. She has never used smokeless tobacco. No history on file for alcohol and drug.  Allergies  Allergen Reactions  . Penicillins   . Shellfish-Derived Products     History reviewed. No pertinent family history. Unacceptable: Noncontributory, unremarkable, or negative. Acceptable: Family history reviewed and not pertinent (If you reviewed it)  Prior to Admission medications   Medication Sig Start Date End Date Taking? Authorizing Provider  acetaminophen (TYLENOL) 650 MG CR tablet Take 650 mg by mouth every 8 (eight) hours as needed for pain.   Yes [provider]  carbidopa-levodopa (SINEMET IR) 25-100 MG tablet Take 2 tablets by mouth 3 (three) times daily. 10/17/11  Yes [provider]  cholecalciferol (VITAMIN D) 25 MCG (1000 UT) tablet Take 1,000 Units by mouth daily.   Yes [provider]  latanoprost (XALATAN) 0.005 % ophthalmic solution Place 1 drop into both eyes at bedtime.    Yes [provider]  loperamide (IMODIUM) 2 MG capsule Take 2-4 mg by mouth See admin instructions. Take 2 capsules (4mg ) by mouth  after first loose stool and then take 1 capsule (2mg ) by mouth after each subsequent loose stool - max 8 capsules in 24 hours   Yes [provider]  mineral oil-hydrophilic petrolatum  (AQUAPHOR) ointment Apply 1 application topically Nightly.   Yes [provider]  Morphine Sulfate (MORPHINE CONCENTRATE) 10 mg / 0.5 ml concentrated solution Take 4 mg by mouth every 2 (two) hours as needed for severe pain or shortness of breath.   Yes [provider]  Polyethyl Glycol-Propyl Glycol (SYSTANE) 0.4-0.3 % SOLN Place 1 drop into both eyes 2 (two) times daily as needed (dry eyes).   Yes [provider]  senna (SENOKOT) 8.6 MG TABS tablet Take 8.6 mg by mouth 2 (two) times daily as needed for mild constipation.   Yes [provider]  Zinc Oxide (DESITIN) 13 % CREA Apply 1 application topically 3 (three) times daily. (after toileting)   Yes [provider]  acetaminophen (TYLENOL) 325 MG tablet Take 650 mg by mouth every 6 (six) hours as needed.    [provider]  guaiFENesin-dextromethorphan (ROBITUSSIN DM) 100-10 MG/5ML syrup Take 5 mLs by mouth every 4 (four) hours as needed for cough.    [provider]  naloxone [facility]) nasal spray 4 mg/0.1 mL Place 1 spray into the nose.    [provider]    Physical Exam: Vitals:   07/27/19 1449 07/27/19 1500 07/27/19 1530 07/27/19 1622  BP: 140/85 134/77 133/88   Pulse: 71 72 74   Resp: 20 17 11    Temp: 98.3 F (36.8 C)     TempSrc: Oral     SpO2: 100% 100% 100%   Weight:    90.7 kg  Height:        Constitutional: NAD, calm, comfortable Vitals:   07/27/19 1449 07/27/19 1500 07/27/19 1530 07/27/19 1622  BP: 140/85 134/77 133/88   Pulse: 71 72 74   Resp: 20 17 11    Temp: 98.3 F (36.8 C)     TempSrc: Oral     SpO2: 100% 100% 100%   Weight:    90.7 kg  Height:       General: elderly woman in no distress, head cocked to the right. Eyes: Proptosis noted, PERRL, lids and conjunctivae normal ENMT: Mucous membranes are moist. Posterior pharynx clear of any exudate or lesions.Dentition - missing many teeth but has native teeth in good condition..  Neck:  normal, stiff, no masses, no thyromegaly Respiratory: clear to auscultation bilaterally, no wheezing, no crackles. Normal respiratory effort. No accessory muscle use.  Cardiovascular: Regular rate and rhythm, no murmurs / rubs / gallops. No extremity edema. 1+ pedal pulses. No carotid bruits.  Abdomen:  Tenderness in upper abdomen, no lower abdominal tenderness, no guarding, no masses palpated. No hepatosplenomegaly. Bowel sounds positive.  Musculoskeletal: no clubbing / cyanosis. HE and hands w/o deformity, feet with hammer toe deformities.. Decreased movement UE - 2/2 stiffness, poor movement knees limited by pain.  no contractures. Decreased muscle tone.  Skin: no rashes, lesions, ulcers. No induration. Was not able to view sacrum Neurologic: CN 2-12 grossly intact. Sensation intact, . Strength 3/5 in all 4. No resting tremor noted. UE with stiffness, mild cog-wheeling both UE. Psychiatric: Normal judgment and insight, no formal cognitive testing done. Alert and oriented to person, place and knows she is sick with a bleeding problem.. Normal mood.   (Anything < 9 systems with 2 bullets each down codes to level 1) (If patient refuses exam can't  bill higher level) (Make sure to document decubitus ulcers present on admission -- if possible -- and whether patient has chronic indwelling catheter at time of admission)  Labs on Admission: I have personally reviewed following labs and imaging studies  CBC: Recent Labs  Lab 07/27/19 1458  WBC 5.0  NEUTROABS 2.6  HGB 8.6*  HCT 28.7*  MCV 87.2  PLT [id number]   Basic Metabolic Panel: Recent Labs  Lab 07/27/19 1458  NA 141  K 4.0  CL 108  CO2 27  GLUCOSE 108*  BUN 24*  CREATININE 0.53  CALCIUM 8.6*   GFR: Estimated Creatinine Clearance: 55 mL/min (by C-G formula based on SCr of 0.53 mg/dL). Liver Function Tests: Recent Labs  Lab 07/27/19 1458  AST 11*  ALT <5  ALKPHOS 44  BILITOT 0.4  PROT 6.1*  ALBUMIN 3.1*   No results for  input(s): LIPASE, AMYLASE in the last 168 hours. No results for input(s): AMMONIA in the last 168 hours. Coagulation Profile: Recent Labs  Lab 07/27/19 1458  INR 1.2   Cardiac Enzymes: No results for input(s): CKTOTAL, CKMB, CKMBINDEX, TROPONINI in the last 168 hours. BNP (last 3 results) No results for input(s): PROBNP in the last 8760 hours. HbA1C: No results for input(s): HGBA1C in the last 72 hours. CBG: No results for input(s): GLUCAP in the last 168 hours. Lipid Profile: No results for input(s): CHOL, HDL, LDLCALC, TRIG, CHOLHDL, LDLDIRECT in the last 72 hours. Thyroid Function Tests: No results for input(s): TSH, T4TOTAL, FREET4, T3FREE, THYROIDAB in the last 72 hours. Anemia Panel: No results for input(s): VITAMINB12, FOLATE, FERRITIN, TIBC, IRON, RETICCTPCT in the last 72 hours. Urine analysis:    Component Value Date/Time   COLORURINE Yellow 04/03/2014 1100   APPEARANCEUR Hazy 04/03/2014 1100   LABSPEC 1.017 04/03/2014 1100   PHURINE 5.0 04/03/2014 1100   GLUCOSEU Negative 04/03/2014 1100   HGBUR 1+ 04/03/2014 1100   BILIRUBINUR Negative 04/03/2014 1100   KETONESUR Negative 04/03/2014 1100   PROTEINUR Negative 04/03/2014 1100   NITRITE Positive 04/03/2014 1100   LEUKOCYTESUR 1+ 04/03/2014 1100    Radiological Exams on Admission: DG Abd 1 View  Result Date: 07/27/2019 CLINICAL DATA:  Rectal bleeding. EXAM: ABDOMEN - 1 VIEW COMPARISON:  None. FINDINGS: The bowel gas pattern is normal. Moderate to marked bowel content is identified throughout colon. Degenerative joint changes of the spine are identified. Prior cholecystectomy clips are noted. IMPRESSION: No bowel obstruction. Moderate to marked bowel content identified throughout colon. This can be seen in constipation. Electronically Signed   By: Abelardo Diesel M.D.   On: 07/27/2019 16:46    EKG: Independently reviewed. Sinus rhythm, occasional PVC, no acute changes  Assessment/Plan Active Problems:   Parkinson  disease ([place])   Dementia due to Parkinson's disease without behavioral disturbance (H[facility])   GI bleed   Hematochezia  (please populate well all problems here in Problem List. (For example, if patient is on BP meds at home and you resume or decide to hold them, it is a problem that needs to be her. Same for CAD, COPD, HLD and so on)   1. GI bleed/hematochezia - no active bleeding in ED but did have maroon stools in the vault by ED examiner. Had previous bleed, likely diverticular in nature with negative EGD. Hgb 8.6. Plan Observe on med-surge floor  H/H at 1800 and midnight, CBC in AM  IV - NS at [facility]  GI consult to see patient in AM  2. Dementia - stable  3. Parkinson's - continue home meds  4. HTN  - BP stable   5. Glaucoma - drops ordered   DVT prophylaxis: SCDs (Lovenox/Heparin/SCD's/anticoagulated/None (if comfort care) Code Status: full code  (Full/Partial (specify details) Family Communication: sopke with [employer], grandson. Confimed code status as full code. Explained Dx and tx plan. Answered all questions (Specify name, relationship. Do not write "discussed with patient". Specify tel # if discussed over the phone) Disposition Plan: SNF when stable (specify when and where you expect patient to be discharged) Consults called: Dr. Marius Ditch - GI has been consulted (with names) Admission status: observation (inpatient / obs / tele / medical floor / SDU)   Adella Hare MD [facility] Pager [phone]  If 7PM-7AM, please contact night-coverage [email or web address] Password [facility]  07/27/2019, 4:51 PM

## 2019-07-27 NOTE — ED Triage Notes (Signed)
Pt arrival via [facility] from [facility] due to rectal bleeding. Ems states that the staff at [facility] noticed blood in the patients stool yesterday. Pt has a history of rectal bleeding and was hypotensive and symptomatic in the past, thus they sent her in.   VS with Ems- -BP 118/70 -HR 70's -O2- 96%   Pt's only complaint is pain in her knees.

## 2019-07-27 NOTE — ED Notes (Signed)
Pt given dinner tray and assisted eating her food by this nurse. Pt ate 85% of meal given. Pt denies further needs at this time.

## 2019-07-27 NOTE — ED Notes (Signed)
Pt changed and is clean/dry with a new brief at this time. Slight blood noted on backside.

## 2019-07-28 DIAGNOSIS — H409 Unspecified glaucoma: Secondary | ICD-10-CM | POA: Diagnosis present

## 2019-07-28 DIAGNOSIS — Z20822 Contact with and (suspected) exposure to covid-19: Secondary | ICD-10-CM | POA: Diagnosis present

## 2019-07-28 DIAGNOSIS — D649 Anemia, unspecified: Secondary | ICD-10-CM | POA: Diagnosis present

## 2019-07-28 DIAGNOSIS — Z880 Allergy status to penicillin: Secondary | ICD-10-CM | POA: Diagnosis not present

## 2019-07-28 DIAGNOSIS — G20 Parkinson's disease: Secondary | ICD-10-CM | POA: Diagnosis not present

## 2019-07-28 DIAGNOSIS — K921 Melena: Secondary | ICD-10-CM | POA: Diagnosis present

## 2019-07-28 DIAGNOSIS — Z79891 Long term (current) use of opiate analgesic: Secondary | ICD-10-CM | POA: Diagnosis not present

## 2019-07-28 DIAGNOSIS — E876 Hypokalemia: Secondary | ICD-10-CM | POA: Diagnosis not present

## 2019-07-28 DIAGNOSIS — E611 Iron deficiency: Secondary | ICD-10-CM | POA: Diagnosis present

## 2019-07-28 DIAGNOSIS — Z79899 Other long term (current) drug therapy: Secondary | ICD-10-CM | POA: Diagnosis not present

## 2019-07-28 DIAGNOSIS — Z66 Do not resuscitate: Secondary | ICD-10-CM | POA: Diagnosis present

## 2019-07-28 DIAGNOSIS — K922 Gastrointestinal hemorrhage, unspecified: Secondary | ICD-10-CM | POA: Diagnosis not present

## 2019-07-28 DIAGNOSIS — D129 Benign neoplasm of anus and anal canal: Secondary | ICD-10-CM | POA: Diagnosis present

## 2019-07-28 DIAGNOSIS — Z9049 Acquired absence of other specified parts of digestive tract: Secondary | ICD-10-CM | POA: Diagnosis not present

## 2019-07-28 DIAGNOSIS — I10 Essential (primary) hypertension: Secondary | ICD-10-CM | POA: Diagnosis present

## 2019-07-28 DIAGNOSIS — Z7189 Other specified counseling: Secondary | ICD-10-CM | POA: Diagnosis not present

## 2019-07-28 DIAGNOSIS — Z515 Encounter for palliative care: Secondary | ICD-10-CM | POA: Diagnosis not present

## 2019-07-28 DIAGNOSIS — D62 Acute posthemorrhagic anemia: Secondary | ICD-10-CM | POA: Diagnosis not present

## 2019-07-28 DIAGNOSIS — F0280 Dementia in other diseases classified elsewhere without behavioral disturbance: Secondary | ICD-10-CM | POA: Diagnosis present

## 2019-07-28 LAB — CBC
HCT: 29.9 % — ABNORMAL LOW (ref 36.0–46.0)
Hemoglobin: 8.8 g/dL — ABNORMAL LOW (ref 12.0–15.0)
MCH: 25.9 pg — ABNORMAL LOW (ref 26.0–34.0)
MCHC: 29.4 g/dL — ABNORMAL LOW (ref 30.0–36.0)
MCV: 87.9 fL (ref 80.0–100.0)
Platelets: 161 10*3/uL (ref 150–400)
RBC: 3.4 MIL/uL — ABNORMAL LOW (ref 3.87–5.11)
RDW: 14.4 % (ref 11.5–15.5)
WBC: 4.4 10*3/uL (ref 4.0–10.5)
nRBC: 0 % (ref 0.0–0.2)

## 2019-07-28 LAB — HEMOGLOBIN AND HEMATOCRIT, BLOOD
HCT: 26.8 % — ABNORMAL LOW (ref 36.0–46.0)
Hemoglobin: 8 g/dL — ABNORMAL LOW (ref 12.0–15.0)

## 2019-07-28 LAB — VITAMIN B12: Vitamin B-12: 494 pg/mL (ref 180–914)

## 2019-07-28 LAB — MRSA PCR SCREENING: MRSA by PCR: NEGATIVE

## 2019-07-28 LAB — FERRITIN: Ferritin: 27 ng/mL (ref 11–307)

## 2019-07-28 LAB — FOLATE: Folate: 7.7 ng/mL (ref >5.9)

## 2019-07-28 MED ORDER — MAGNESIUM CITRATE PO SOLN
1.0000 | Freq: Once | ORAL | Status: AC
  Administered 2019-07-28: 18:00:00 1 via ORAL
  Filled 2019-07-28: qty 296

## 2019-07-28 MED ORDER — SODIUM CHLORIDE 0.9 % IV SOLN
510.0000 mg | Freq: Once | INTRAVENOUS | Status: AC
  Administered 2019-07-28: 510 mg via INTRAVENOUS
  Filled 2019-07-28: qty 17

## 2019-07-28 MED ORDER — PEG 3350-KCL-NA BICARB-NACL 420 G PO SOLR
4000.0000 mL | Freq: Once | ORAL | Status: DC
  Filled 2019-07-28: qty 4000

## 2019-07-28 NOTE — Progress Notes (Signed)
Pt now has #20 placed by IV team with [place]. IV medication given as ordered. Pt remains quiet, but is able to state that she does no have pain or discomfort. She is resting in the bed and call bell is within reach. No visible distress noted.

## 2019-07-28 NOTE — Consult Note (Signed)
Cephas Darby, MD [address]  [place]  [place], [place] [address]  Main: [phone]  Fax: [phone] Pager: [phone]   Consultation  Referring Provider:     No ref. provider found Primary Care Physician:  Gareth Morgan, MD Primary Gastroenterologist:  Dr. Sherri Sear       Reason for Consultation: Rectal bleeding  Date of Admission:  07/27/2019 Date of Consultation:  07/28/2019         HPI:   Frances Gardner is a 86 y.o. female presented from [place] secondary to episode of rectal bleeding yesterday.  She did not have any further episodes since admission.  She was hemodynamically stable, labs revealed hemoglobin of 8.6 which is infact has improved compared to her previous admission in 06/2018.  Patient was admitted 1 year ago secondary to hematochezia, hypotension, positive bleeding scan at the splenic flexure, subsequently underwent IR angio for embolization which was not done due to lack of extravasation.  She then underwent upper endoscopy which was normal.  Colonoscopy was attempted at that time but not performed due to inadequate prep after attempting bowel cleansing for 2 days.  Patient's hematochezia resolved at that time.  She is also found to have mild iron deficiency and received IV iron during previous admission. History obtained from chart review.  She denied abdominal pain, nausea or vomiting.  BUN/creatinine at baseline.  Iron studies revealed iron deficiency as well   NSAIDs: None  Antiplts/Anticoagulants/Anti thrombotics: None  GI Procedures: EGD 06/2018 unremarkable  Past Medical History:  Diagnosis Date  . Dementia ([place])   . Hypothyroidism   . Parkinson's disease [facility])     Past Surgical History:  Procedure Laterality Date  . COLONOSCOPY WITH PROPOFOL N/A 07/03/2018   Procedure: COLONOSCOPY WITH PROPOFOL;  Surgeon: Lin Landsman, MD;  Location: [facility] ENDOSCOPY;  Service: Gastroenterology;  Laterality: N/A;  .  ESOPHAGOGASTRODUODENOSCOPY (EGD) WITH PROPOFOL N/A 07/03/2018   Procedure: ESOPHAGOGASTRODUODENOSCOPY (EGD) WITH PROPOFOL;  Surgeon: Lin Landsman, MD;  Location: [facility] ENDOSCOPY;  Service: Gastroenterology;  Laterality: N/A;  . IR ANGIOGRAM VISCERAL SELECTIVE  06/30/2018    Prior to Admission medications   Medication Sig Start Date End Date Taking? Authorizing Provider  acetaminophen (TYLENOL) 650 MG CR tablet Take 650 mg by mouth every 8 (eight) hours as needed for pain.   Yes [provider]  carbidopa-levodopa (SINEMET IR) 25-100 MG tablet Take 2 tablets by mouth 3 (three) times daily. 10/17/11  Yes [provider]  cholecalciferol (VITAMIN D) 25 MCG (1000 UT) tablet Take 1,000 Units by mouth daily.   Yes [provider]  latanoprost (XALATAN) 0.005 % ophthalmic solution Place 1 drop into both eyes at bedtime.    Yes [provider]  loperamide (IMODIUM) 2 MG capsule Take 2-4 mg by mouth See admin instructions. Take 2 capsules (78m) by mouth after first loose stool and then take 1 capsule (247m by mouth after each subsequent loose stool - max 8 capsules in 24 hours   Yes [provider]  mineral oil-hydrophilic petrolatum (AQUAPHOR) ointment Apply 1 application topically Nightly.   Yes [provider]  Morphine Sulfate (MORPHINE CONCENTRATE) 10 mg / 0.5 ml concentrated solution Take 4 mg by mouth every 2 (two) hours as needed for severe pain or shortness of breath.   Yes [provider]  Polyethyl Glycol-Propyl Glycol (SYSTANE) 0.4-0.3 % SOLN Place 1 drop into both eyes 2 (two) times daily as needed (dry eyes).  Yes [provider]  senna (SENOKOT) 8.6 MG TABS tablet Take 8.6 mg by mouth 2 (two) times daily as needed for mild constipation.   Yes [provider]  Zinc Oxide (DESITIN) 13 % CREA Apply 1 application topically 3 (three) times daily. (after toileting)   Yes [provider]  acetaminophen  (TYLENOL) 325 MG tablet Take 650 mg by mouth every 6 (six) hours as needed.    [provider]  guaiFENesin-dextromethorphan (ROBITUSSIN DM) 100-10 MG/5ML syrup Take 5 mLs by mouth every 4 (four) hours as needed for cough.    [provider]  naloxone [facility]) nasal spray 4 mg/0.1 mL Place 1 spray into the nose.    [provider]    Current Facility-Administered Medications:  .  acetaminophen (TYLENOL) tablet 650 mg, 650 mg, Oral, Q6H PRN, Norins, Heinz Knuckles, MD, 650 mg at 07/27/19 2101 .  carbidopa-levodopa (SINEMET IR) 25-100 MG per tablet immediate release 2 tablet, 2 tablet, Oral, TID, Norins, Heinz Knuckles, MD, 2 tablet at 07/28/19 1814 .  latanoprost (XALATAN) 0.005 % ophthalmic solution 1 drop, 1 drop, Both Eyes, QHS, Norins, Heinz Knuckles, MD, 1 drop at 07/27/19 2107 .  morphine CONCENTRATE 10 MG/0.5ML oral solution 4 mg, 4 mg, Oral, Q2H PRN, Norins, Heinz Knuckles, MD .  polyethylene glycol-electrolytes (NuLYTELY) solution 4,000 mL, 4,000 mL, Oral, Once, Trinitie Mcgirr, Tally Due, MD .  polyvinyl alcohol (LIQUIFILM TEARS) 1.4 % ophthalmic solution 1 drop, 1 drop, Both Eyes, BID PRN, Norins, Heinz Knuckles, MD .  senna (SENOKOT) tablet 8.6 mg, 8.6 mg, Oral, BID PRN, Norins, Heinz Knuckles, MD  History reviewed. No pertinent family history.   Social History   Tobacco Use  . Smoking status: Never Smoker  . Smokeless tobacco: Never Used  Substance Use Topics  . Alcohol use: Not on file  . Drug use: Not on file    Allergies as of 07/27/2019 - Review Complete 07/27/2019  Allergen Reaction Noted  . Penicillins  06/29/2018  . Shellfish-derived products  06/29/2018    Review of Systems:    All systems reviewed and negative except where noted in HPI.   Physical Exam:  Vital signs in last 24 hours: Temp:  [98 F (36.7 C)-98.2 F (36.8 C)] 98 F (36.7 C) (02/02 1521) Pulse Rate:  [66-83] 83 (02/02 1521) Resp:  [18] 18 (02/02 1521) BP: (99-125)/(61-96) 99/68 (02/02 1521) SpO2:   [100 %] 100 % (02/02 1521) Weight:  [77.8 kg] 77.8 kg (02/01 2037) Last BM Date: (pt. unsure) General: Lethargic, pale appearing, not in distress Head:  Normocephalic and atraumatic. Eyes:   No icterus.   Conjunctiva pale. PERRLA. Ears:  Normal auditory acuity. Neck:  Supple; no masses or thyroidomegaly Lungs: Respirations even and unlabored. Lungs clear to auscultation bilaterally.   No wheezes, crackles, or rhonchi.  Heart:  Regular rate and rhythm;  Without murmur, clicks, rubs or gallops Abdomen:  Soft, nondistended, nontender. Normal bowel sounds. No appreciable masses or hepatomegaly.  No rebound or guarding.  Rectal:  Not performed. Msk:  Symmetrical without gross deformities.   Extremities:  Without edema, cyanosis or clubbing. Neurologic:  Alert and oriented x2;  grossly normal neurologically.  LAB RESULTS: CBC Latest Ref Rng & Units 07/28/2019 07/28/2019 07/27/2019  WBC 4.0 - 10.5 K/uL 4.4 - -  Hemoglobin 12.0 - 15.0 g/dL 8.8(L) 8.0(L) 8.2(L)  Hematocrit 36.0 - 46.0 % 29.9(L) 26.8(L) 27.2(L)  Platelets 150 - 400 K/uL 161 - -    BMET BMP Latest Ref Rng &  Units 07/27/2019 07/02/2018 07/01/2018  Glucose 70 - 99 mg/dL 108(H) 91 103(H)  BUN 8 - 23 mg/dL 24(H) 26(H) 26(H)  Creatinine 0.44 - 1.00 mg/dL 0.53 0.59 0.60  Sodium 135 - 145 mmol/L 141 145 143  Potassium 3.5 - 5.1 mmol/L 4.0 3.5 3.2(L)  Chloride 98 - 111 mmol/L 108 120(H) 119(H)  CO2 22 - 32 mmol/L 27 21(L) 22  Calcium 8.9 - 10.3 mg/dL 8.6(L) 7.9(L) 7.7(L)    LFT Hepatic Function Latest Ref Rng & Units 07/27/2019 06/29/2018 10/17/2012  Total Protein 6.5 - 8.1 g/dL 6.1(L) 5.2(L) 7.5  Albumin 3.5 - 5.0 g/dL 3.1(L) 2.6(L) 3.0(L)  AST 15 - 41 U/L 11(L) 17 16  ALT 0 - 44 U/L <5 <5 8(L)  Alk Phosphatase 38 - 126 U/L 44 34(L) 84  Total Bilirubin 0.3 - 1.2 mg/dL 0.4 0.6 0.4     STUDIES: DG Abd 1 View  Result Date: 07/27/2019 CLINICAL DATA:  Rectal bleeding. EXAM: ABDOMEN - 1 VIEW COMPARISON:  None. FINDINGS: The bowel gas pattern  is normal. Moderate to marked bowel content is identified throughout colon. Degenerative joint changes of the spine are identified. Prior cholecystectomy clips are noted. IMPRESSION: No bowel obstruction. Moderate to marked bowel content identified throughout colon. This can be seen in constipation. Electronically Signed   By: Abelardo Diesel M.D.   On: 07/27/2019 16:46      Impression / Plan:   Frances Gardner is a 86 y.o. female with history of dementia, prior episodes of hematochezia, positive bleeding scan in the splenic flexure in 06/2018, EGD unremarkable is admitted with recurrence of rectal bleeding.  Patient's hemoglobin is overall stable.  Did not require any blood transfusion  Will attempt colonoscopy again tomorrow.  If patient is unable to do prep, will perform flexible sigmoidoscopy Okay with clear liquid diet N.p.o. past midnight Recommend parenteral iron to treat iron deficiency  Thank you for involving me in the care of this patient.      LOS: 0 days   Sherri Sear, MD  07/28/2019, 6:42 PM   Note: This dictation was prepared with Dragon dictation along with smaller phrase technology. Any transcriptional errors that result from this process are unintentional.

## 2019-07-28 NOTE — Progress Notes (Signed)
No stools, no rectal bleeding.

## 2019-07-28 NOTE — Progress Notes (Addendum)
Subjective: No new episode of hematochezia since admission.  Objective: Vital signs in last 24 hours: Temp:  [98.1 F (36.7 C)-98.3 F (36.8 C)] 98.1 F (36.7 C) (02/02 0721) Pulse Rate:  [66-75] 66 (02/02 0722) Resp:  [11-20] 18 (02/01 2037) BP: (122-143)/(61-96) 125/61 (02/02 0722) SpO2:  [100 %] 100 % (02/02 0722) Weight:  [77.8 kg-169 kg] 77.8 kg (02/01 2037)  Intake/Output from previous day: 02/01 0701 - 02/02 0700 In: 380 [P.O.:300; I.V.:80] Out: 250 [Urine:250] Intake/Output this shift: No intake/output data recorded.  General:  Elderly woman.  Lying on bed and has some extremity shakiness,  Eyes: Proptosis noted, PERRL, lids and conjunctivae normal ENMT: Mucous membranes are moist. Posterior pharynx clear of any exudate or lesions.Dentition - missing many teeth but has native teeth in good condition..  Neck: normal, stiff, no masses, no thyromegaly Respiratory: clear to auscultation bilaterally, no wheezing, no crackles. Normal respiratory effort. No accessory muscle use.  Cardiovascular: Regular rate and rhythm, no murmurs / rubs / gallops. No extremity edema. 1+ pedal pulses. No carotid bruits.  Abdomen:  Tenderness in upper abdomen, no lower abdominal tenderness, no guarding, no masses palpated. No hepatosplenomegaly. Bowel sounds positive.  Musculoskeletal: no clubbing / cyanosis. HE and hands w/o deformity, feet with hammer toe deformities.. Decreased movement UE - 2/2 stiffness, poor movement knees limited by pain.  no contractures. Decreased muscle tone.  Skin: no rashes, lesions, ulcers. No induration. Was not able to view sacrum Neurologic: CN 2-12 grossly intact. Sensation intact, . Strength 3/5 in all 4. No resting tremor noted. UE with stiffness, mild cog-wheeling both UE. Psychiatric: Normal judgment and insight, no formal cognitive testing done. Alert and oriented to person, place and knows she is sick with a bleeding problem.. Normal mood.   Results for orders  placed or performed during the hospital encounter of 07/27/19 (from the past 24 hour(s))  CBC with Differential     Status: Abnormal   Collection Time: 07/27/19  2:58 PM  Result Value Ref Range   WBC 5.0 4.0 - 10.5 K/uL   RBC 3.29 (L) 3.87 - 5.11 MIL/uL   Hemoglobin 8.6 (L) 12.0 - 15.0 g/dL   HCT 28.7 (L) 36.0 - 46.0 %   MCV 87.2 80.0 - 100.0 fL   MCH 26.1 26.0 - 34.0 pg   MCHC 30.0 30.0 - 36.0 g/dL   RDW 14.6 11.5 - 15.5 %   Platelets 159 150 - 400 K/uL   nRBC 0.0 0.0 - 0.2 %   Neutrophils Relative % 52 %   Neutro Abs 2.6 1.7 - 7.7 K/uL   Lymphocytes Relative 39 %   Lymphs Abs 2.0 0.7 - 4.0 K/uL   Monocytes Relative 6 %   Monocytes Absolute 0.3 0.1 - 1.0 K/uL   Eosinophils Relative 2 %   Eosinophils Absolute 0.1 0.0 - 0.5 K/uL   Basophils Relative 1 %   Basophils Absolute 0.0 0.0 - 0.1 K/uL   Immature Granulocytes 0 %   Abs Immature Granulocytes 0.01 0.00 - 0.07 K/uL  Comprehensive metabolic panel     Status: Abnormal   Collection Time: 07/27/19  2:58 PM  Result Value Ref Range   Sodium 141 135 - 145 mmol/L   Potassium 4.0 3.5 - 5.1 mmol/L   Chloride 108 98 - 111 mmol/L   CO2 27 22 - 32 mmol/L   Glucose, Bld 108 (H) 70 - 99 mg/dL   BUN 24 (H) 8 - 23 mg/dL   Creatinine, Ser 0.53 0.44 -  1.00 mg/dL   Calcium 8.6 (L) 8.9 - 10.3 mg/dL   Total Protein 6.1 (L) 6.5 - 8.1 g/dL   Albumin 3.1 (L) 3.5 - 5.0 g/dL   AST 11 (L) 15 - 41 U/L   ALT <5 0 - 44 U/L   Alkaline Phosphatase 44 38 - 126 U/L   Total Bilirubin 0.4 0.3 - 1.2 mg/dL   GFR calc non Af Amer >60 >60 mL/min   GFR calc Af Amer >60 >60 mL/min   Anion gap 6 5 - 15  Protime-INR     Status: None   Collection Time: 07/27/19  2:58 PM  Result Value Ref Range   Prothrombin Time 14.7 11.4 - 15.2 seconds   INR 1.2 0.8 - 1.2  Type and screen     Status: None   Collection Time: 07/27/19  3:26 PM  Result Value Ref Range   ABO/RH(D) B POS    Antibody Screen NEG    Sample Expiration      07/30/2019,2359 Performed at  [facility], [address] [place], [place] [address]   Respiratory Panel by RT PCR (Flu A&B, Covid) - Nasopharyngeal Swab     Status: None   Collection Time: 07/27/19  4:22 PM   Specimen: Nasopharyngeal Swab  Result Value Ref Range   SARS Coronavirus 2 by RT PCR NEGATIVE NEGATIVE   Influenza A by PCR NEGATIVE NEGATIVE   Influenza B by PCR NEGATIVE NEGATIVE  Hemoglobin and hematocrit, blood     Status: Abnormal   Collection Time: 07/27/19  5:43 PM  Result Value Ref Range   Hemoglobin 8.2 (L) 12.0 - 15.0 g/dL   HCT 27.2 (L) 36.0 - 46.0 %  MRSA PCR Screening     Status: None   Collection Time: 07/27/19  9:18 PM   Specimen: Nasopharyngeal  Result Value Ref Range   MRSA by PCR NEGATIVE NEGATIVE  Hemoglobin and hematocrit, blood     Status: Abnormal   Collection Time: 07/28/19 12:08 AM  Result Value Ref Range   Hemoglobin 8.0 (L) 12.0 - 15.0 g/dL   HCT 26.8 (L) 36.0 - 46.0 %  CBC     Status: Abnormal   Collection Time: 07/28/19  6:22 AM  Result Value Ref Range   WBC 4.4 4.0 - 10.5 K/uL   RBC 3.40 (L) 3.87 - 5.11 MIL/uL   Hemoglobin 8.8 (L) 12.0 - 15.0 g/dL   HCT 29.9 (L) 36.0 - 46.0 %   MCV 87.9 80.0 - 100.0 fL   MCH 25.9 (L) 26.0 - 34.0 pg   MCHC 29.4 (L) 30.0 - 36.0 g/dL   RDW 14.4 11.5 - 15.5 %   Platelets 161 150 - 400 K/uL   nRBC 0.0 0.0 - 0.2 %    Studies/Results: DG Abd 1 View  Result Date: 07/27/2019 CLINICAL DATA:  Rectal bleeding. EXAM: ABDOMEN - 1 VIEW COMPARISON:  None. FINDINGS: The bowel gas pattern is normal. Moderate to marked bowel content is identified throughout colon. Degenerative joint changes of the spine are identified. Prior cholecystectomy clips are noted. IMPRESSION: No bowel obstruction. Moderate to marked bowel content identified throughout colon. This can be seen in constipation. Electronically Signed   By: Abelardo Diesel M.D.   On: 07/27/2019 16:46    Scheduled Meds: . carbidopa-levodopa  2 tablet Oral TID  . latanoprost  1 drop Both  Eyes QHS   Continuous Infusions: . sodium chloride Stopped (07/28/19 0401)   PRN Meds:acetaminophen, morphine CONCENTRATE, polyvinyl alcohol, senna  Assessment/Plan:  1. GI bleed/hematochezia - no active bleeding since coming to the ED  - in ED but did have maroon stools in the vault by ED examiner - Had previous bleed, likely diverticular in nature with negative EGD. H -Hemoglobin remained above 8 hours similar to the admission hemoglobin -Consulted GI, Dr. Marius Ditch.  Recommended keep the patient n.p.o. -Patient is n.p.o since last night. we will follow further GI recommendation -Continue IV fluid  2. Dementia - stable  3. Parkinson's - continue home meds Sinemet  4. HTN  - BP stable   5. Glaucoma - drops ordered   DVT prophylaxis: SCDs   Code Status: full code Family Communication: Admitting MD sopke with Lillie Columbia, grandson. Confimed code status as full code. Explained Dx and tx plan. Answered all questions.  Disposition Plan: SNF when stable  Consults called: Dr. Marius Ditch - GI has been consulted    LOS: 0 days   Jocelynn Gioffre Izetta Dakin

## 2019-07-29 ENCOUNTER — Encounter: Payer: Self-pay | Admitting: Family Medicine

## 2019-07-29 ENCOUNTER — Encounter
Admission: EM | Disposition: A | Discharge status: Skilled Nursing Facility | Payer: Self-pay | Source: Skilled Nursing Facility | Attending: Internal Medicine

## 2019-07-29 ENCOUNTER — Ambulatory Visit: Admit: 2019-07-29 | Payer: No Typology Code available for payment source | Admitting: Gastroenterology

## 2019-07-29 ENCOUNTER — Encounter: Payer: Self-pay | Admitting: Anesthesiology

## 2019-07-29 DIAGNOSIS — I10 Essential (primary) hypertension: Secondary | ICD-10-CM

## 2019-07-29 HISTORY — PX: FLEXIBLE SIGMOIDOSCOPY: SHX5431

## 2019-07-29 LAB — HEMOGLOBIN AND HEMATOCRIT, BLOOD
HCT: 22.8 % — ABNORMAL LOW (ref 36.0–46.0)
HCT: 25.5 % — ABNORMAL LOW (ref 36.0–46.0)
Hemoglobin: 6.7 g/dL — ABNORMAL LOW (ref 12.0–15.0)
Hemoglobin: 7.9 g/dL — ABNORMAL LOW (ref 12.0–15.0)

## 2019-07-29 LAB — PREPARE RBC (CROSSMATCH)

## 2019-07-29 SURGERY — SIGMOIDOSCOPY, FLEXIBLE
Anesthesia: General

## 2019-07-29 SURGERY — COLONOSCOPY
Anesthesia: General

## 2019-07-29 MED ORDER — ACETAMINOPHEN 325 MG PO TABS: 650.0000 mg | ORAL_TABLET | Freq: Once | ORAL | Status: DC

## 2019-07-29 MED ORDER — SODIUM CHLORIDE 0.9 % IV SOLN
INTRAVENOUS | Status: DC
  Administered 2019-07-29: 16:00:00 1000 mL via INTRAVENOUS

## 2019-07-29 MED ORDER — MAGNESIUM CITRATE PO SOLN
1.0000 | Freq: Once | ORAL | Status: AC
  Administered 2019-07-29: 1 via ORAL
  Filled 2019-07-29: qty 296

## 2019-07-29 MED ORDER — SODIUM CHLORIDE 0.9% IV SOLUTION: Freq: Once | INTRAVENOUS | Status: AC

## 2019-07-29 NOTE — TOC Progression Note (Addendum)
Transition of Care [facility]) - Progression Note    Patient Details  Name: Frances Gardner MRN: [id number] Date of Birth: 10/09/1926  Transition of [facility]) CM/SW [place], RN Phone Number: 07/29/2019, 11:04 AM  Clinical Narrative:    Called [facility] to touch base about the [facility] patient spoke with Kirke Shaggy patient is from [facility] and will be returning to [facility]        Expected Discharge Plan and Services                                                 Social Determinants of Health (SDOH) Interventions    Readmission Risk Interventions No flowsheet data found.

## 2019-07-29 NOTE — Op Note (Signed)
[facility] Gastroenterology Patient Name: Frances Gardner Procedure Date: 07/29/2019 3:21 PM MRN: [id number] Account #: [id number] Date of Birth: 10/03/1926 Admit Type: Outpatient Age: 86 Room: [facility] ENDO ROOM 4 Gender: Female Note Status: Finalized Procedure:             Flexible Sigmoidoscopy Indications:           Hematochezia Providers:             Lin Landsman MD, MD Medicines:             None Complications:         No immediate complications. Estimated blood loss: None. Procedure:             Pre-Anesthesia Assessment:                        - Prior to the procedure, a History and Physical was                         performed, and patient medications and allergies were                         reviewed. The patient is unable to give consent                         secondary to the patient being legally incompetent to                         consent. The risks and benefits of the procedure and                         the sedation options and risks were discussed with the                         patient's guardian. All questions were answered and                         informed consent was obtained. Patient identification                         and proposed procedure were verified by the physician,                         the nurse and the technician in the pre-procedure area                         in the procedure room in the endoscopy suite. Mental                         Status Examination: dementia. Airway Examination:                         normal oropharyngeal airway and neck mobility.                         Respiratory Examination: clear to auscultation. CV                         Examination: normal. Prophylactic Antibiotics: The  patient does not require prophylactic antibiotics.                         Prior Anticoagulants: The patient has taken no                         previous anticoagulant or antiplatelet agents.  ASA                         Grade Assessment: III - A patient with severe systemic                         disease. After reviewing the risks and benefits, the                         patient was deemed in satisfactory condition to                         undergo the procedure. The anesthesia plan was to use                         no sedation or anesthesia. Immediately prior to                         administration of medications, the patient was                         re-assessed for adequacy to receive sedatives. The                         heart rate, respiratory rate, oxygen saturations,                         blood pressure, adequacy of pulmonary ventilation, and                         response to care were monitored throughout the                         procedure. The physical status of the patient was                         re-assessed after the procedure.                        After obtaining informed consent, the scope was passed                         under direct vision. The Endoscope was introduced                         through the anus and advanced to the the rectosigmoid                         junction. The flexible sigmoidoscopy was performed                         with difficulty due to poor bowel prep with stool  present. The patient tolerated the procedure fairly                         well. The quality of the bowel preparation was poor. Findings:      The digital rectal exam was normal. Pertinent negatives include normal       sphincter tone and no palpable rectal lesions.      Hematin (altered blood/coffee-ground-like material) was found in the       rectum and in the recto-sigmoid colon. Impression:            - Preparation of the colon was poor.                        - Blood in the rectum and in the recto-sigmoid colon.                        - No specimens collected. Recommendation:        - Return patient to hospital ward for  ongoing care.                        - Clear liquid diet today.                        - Perform a colonoscopy tomorrow. Procedure Code(s):     --- Professional ---                        [phone], 13, Sigmoidoscopy, flexible; diagnostic,                         including collection of specimen(s) by brushing or                         washing, when performed (separate procedure) Diagnosis Code(s):     --- Professional ---                        K62.5, Hemorrhage of anus and rectum                        K92.2, Gastrointestinal hemorrhage, unspecified                        K92.1, Melena (includes Hematochezia) CPT copyright 2019 [employer] Medical Association. All rights reserved. The codes documented in this report are preliminary and upon coder review may  be revised to meet current compliance requirements. Dr. Ulyess Mort Lin Landsman MD, MD 07/29/2019 3:53:11 PM This report has been signed electronically. Number of Addenda: 0 Note Initiated On: 07/29/2019 3:21 PM Total Procedure Duration: 0 hours 3 minutes 35 seconds  Estimated Blood Loss:  Estimated blood loss: none.      [facility]

## 2019-07-29 NOTE — Progress Notes (Signed)
PROGRESS NOTE    Frances Gardner  [id number] DOB: 11/15/1926 DOA: 07/27/2019 PCP: Gareth Morgan, MD    Brief Narrative:  Frances Gardner is a 86 y.o. female with medical history significant of Parkinson's disease with related dementia, chronic anemia with recorded HGB of 7.9.  Admitted Jan 2020 for GI bleed-negative EGD, did not have colonoscopy, + bleeding scan but bleeding stopped before any procedure. Diagnosis - presumed diverticular bleed. Patient was report by SNF staff to have had blood per rectum. She was referred to [facility] for further evaluatio    Consultants:   GI  Procedures: Flex sig  Antimicrobials:   None   Subjective: Prep not given by evening nurse. H/h low this am. Pt no complaints.  Objective: Vitals:   07/29/19 1524 07/29/19 1540 07/29/19 1549 07/29/19 1558  BP: (!) 115/57 118/83 (!) 150/84 125/63  Pulse: 80 77 80 85  Resp: 18   14  Temp: 98.5 F (36.9 C)     TempSrc: Temporal   Temporal  SpO2: 100% 100% 100% 100%  Weight:      Height:        Intake/Output Summary (Last 24 hours) at 07/29/2019 1822 Last data filed at 07/29/2019 1419 Gross per 24 hour  Intake 552 ml  Output 475 ml  Net 77 ml   Filed Weights   07/27/19 1448 07/27/19 1622 07/27/19 2037  Weight: (!) 169 kg 90.7 kg 77.8 kg    Examination:  General exam: calm, nad Respiratory system: Clear to auscultation. Respiratory effort normal. Cardiovascular system: S1 & S2 heard, RRR. No murmurs, rubs, gallops or clicks.  Gastrointestinal system: Abdomen is nondistended, soft and nontender.  Normal bowel sounds heard, but decreased. Central nervous system: awake, difficult to assess. Grossly intact Extremities: No edema Skin: Warm dry     Data Reviewed: I have personally reviewed following labs and imaging studies  CBC: Recent Labs  Lab 07/27/19 1458 07/27/19 1458 07/27/19 1743 07/28/19 0008 07/28/19 0622 07/29/19 0344 07/29/19 1656  WBC 5.0  --   --   --  4.4  --   --     NEUTROABS 2.6  --   --   --   --   --   --   HGB 8.6*   < > 8.2* 8.0* 8.8* 6.7* 7.9*  HCT 28.7*   < > 27.2* 26.8* 29.9* 22.8* 25.5*  MCV 87.2  --   --   --  87.9  --   --   PLT 159  --   --   --  161  --   --    < > = values in this interval not displayed.   Basic Metabolic Panel: Recent Labs  Lab 07/27/19 1458  NA 141  K 4.0  CL 108  CO2 27  GLUCOSE 108*  BUN 24*  CREATININE 0.53  CALCIUM 8.6*   GFR: Estimated Creatinine Clearance: 51.3 mL/min (by C-G formula based on SCr of 0.53 mg/dL). Liver Function Tests: Recent Labs  Lab 07/27/19 1458  AST 11*  ALT <5  ALKPHOS 44  BILITOT 0.4  PROT 6.1*  ALBUMIN 3.1*   No results for input(s): LIPASE, AMYLASE in the last 168 hours. No results for input(s): AMMONIA in the last 168 hours. Coagulation Profile: Recent Labs  Lab 07/27/19 1458  INR 1.2   Cardiac Enzymes: No results for input(s): CKTOTAL, CKMB, CKMBINDEX, TROPONINI in the last 168 hours. BNP (last 3 results) No results for input(s): PROBNP in the last 8760  hours. HbA1C: No results for input(s): HGBA1C in the last 72 hours. CBG: No results for input(s): GLUCAP in the last 168 hours. Lipid Profile: No results for input(s): CHOL, HDL, LDLCALC, TRIG, CHOLHDL, LDLDIRECT in the last 72 hours. Thyroid Function Tests: No results for input(s): TSH, T4TOTAL, FREET4, T3FREE, THYROIDAB in the last 72 hours. Anemia Panel: Recent Labs    07/28/19 0622 07/28/19 1624 07/28/19 1625  VITAMINB12  --  494  --   FOLATE  --   --  7.7  FERRITIN 27  --   --    Sepsis Labs: No results for input(s): PROCALCITON, LATICACIDVEN in the last 168 hours.  Recent Results (from the past 240 hour(s))  Respiratory Panel by RT PCR (Flu A&B, Covid) - Nasopharyngeal Swab     Status: None   Collection Time: 07/27/19  4:22 PM   Specimen: Nasopharyngeal Swab  Result Value Ref Range Status   SARS Coronavirus 2 by RT PCR NEGATIVE NEGATIVE Final    Comment: (NOTE) SARS-CoV-2 target  nucleic acids are NOT DETECTED. The SARS-CoV-2 RNA is generally detectable in upper respiratoy specimens during the acute phase of infection. The lowest concentration of SARS-CoV-2 viral copies this assay can detect is 131 copies/mL. A negative result does not preclude SARS-Cov-2 infection and should not be used as the sole basis for treatment or other patient management decisions. A negative result may occur with  improper specimen collection/handling, submission of specimen other than nasopharyngeal swab, presence of viral mutation(s) within the areas targeted by this assay, and inadequate number of viral copies (<131 copies/mL). A negative result must be combined with clinical observations, patient history, and epidemiological information. The expected result is Negative. Fact Sheet for Patients:  PinkCheek.be Fact Sheet for Healthcare Providers:  GravelBags.it This test is not yet ap proved or cleared by the [place] FDA and  has been authorized for detection and/or diagnosis of SARS-CoV-2 by FDA under an Emergency Use Authorization (EUA). This EUA will remain  in effect (meaning this test can be used) for the duration of the COVID-19 declaration under Section 564(b)(1) of the Act, 21 U.S.C. section 360bbb-3(b)(1), unless the authorization is terminated or revoked sooner.    Influenza A by PCR NEGATIVE NEGATIVE Final   Influenza B by PCR NEGATIVE NEGATIVE Final    Comment: (NOTE) The Xpert Xpress SARS-CoV-2/FLU/RSV assay is intended as an aid in  the diagnosis of influenza from Nasopharyngeal swab specimens and  should not be used as a sole basis for treatment. Nasal washings and  aspirates are unacceptable for Xpert Xpress SARS-CoV-2/FLU/RSV  testing. Fact Sheet for Patients: PinkCheek.be Fact Sheet for Healthcare Providers: GravelBags.it This test is not  yet approved or cleared by the [place] FDA and  has been authorized for detection and/or diagnosis of SARS-CoV-2 by  FDA under an Emergency Use Authorization (EUA). This EUA will remain  in effect (meaning this test can be used) for the duration of the  Covid-19 declaration under Section 564(b)(1) of the Act, 21  U.S.C. section 360bbb-3(b)(1), unless the authorization is  terminated or revoked. Performed at [facility], [address] [place], [place] [address]   MRSA PCR Screening     Status: None   Collection Time: 07/27/19  9:18 PM   Specimen: Nasopharyngeal  Result Value Ref Range Status   MRSA by PCR NEGATIVE NEGATIVE Final    Comment:        The GeneXpert MRSA Assay (FDA approved for NASAL specimens only), is one  component of a comprehensive MRSA colonization surveillance program. It is not intended to diagnose MRSA infection nor to guide or monitor treatment for MRSA infections. Performed at [facility], [address] [place], [place] [address]          Radiology Studies: No results found.      Scheduled Meds: . acetaminophen  650 mg Oral Once  . carbidopa-levodopa  2 tablet Oral TID  . latanoprost  1 drop Both Eyes QHS  . magnesium citrate  1 Bottle Oral Once  . polyethylene glycol-electrolytes  4,000 mL Oral Once   Continuous Infusions:  Assessment & Plan:   Active Problems:   Lower GI bleed   Dementia due to Parkinson's disease without behavioral disturbance (HCC)   Parkinson disease ([place])   Hematochezia   1. GI bleed/hematochezia - no active bleeding since coming to the ED  - in ED but did have maroon stools in the vault by ED examiner - Had previous bleed, likely diverticular in nature with negative EGD. Hemoglobin 6.7 this a.m., type and cross, transfuse 1 unit packed red blood cells GI, Dr. Marius Ditch following, status post flex sig but prep not good.  Plan for colonoscopy. Need to place NGT for prep for possible  cscope in am   2. Dementia - stable  3. Parkinson's - continue home meds Sinemet  4. HTN - stable   5. Glaucoma -continue Xalatan  DVT prophylaxis:SCDs  Code Status:full code Family Communication:None at bedside Disposition Plan:SNF when stable, currently needs colonoscopy as H&H had dropped.  Transfusion as needed         LOS: 1 day   Time spent: 45 minutes with more than 50% COC    Nolberto Hanlon, MD [facility] Pager [phone]  If 7PM-7AM, please contact night-coverage [email or web address] Password [facility] 07/29/2019, 6:22 PM

## 2019-07-29 NOTE — Progress Notes (Signed)
Patient procedure cancelled due to inability to see due to poor prep.Patient was not sedated and vital signs were stable so patient is returned to room.Report given to Jun Guiral,R.N.

## 2019-07-30 LAB — TYPE AND SCREEN
ABO/RH(D): B POS
Antibody Screen: NEGATIVE
Unit division: 0

## 2019-07-30 LAB — CBC
HCT: 24.1 % — ABNORMAL LOW (ref 36.0–46.0)
Hemoglobin: 7.5 g/dL — ABNORMAL LOW (ref 12.0–15.0)
MCH: 27.4 pg (ref 26.0–34.0)
MCHC: 31.1 g/dL (ref 30.0–36.0)
MCV: 88 fL (ref 80.0–100.0)
Platelets: 135 10*3/uL — ABNORMAL LOW (ref 150–400)
RBC: 2.74 MIL/uL — ABNORMAL LOW (ref 3.87–5.11)
RDW: 14.9 % (ref 11.5–15.5)
WBC: 7.5 10*3/uL (ref 4.0–10.5)
nRBC: 0 % (ref 0.0–0.2)

## 2019-07-30 LAB — GLUCOSE, CAPILLARY: Glucose-Capillary: 85 mg/dL (ref 70–99)

## 2019-07-30 LAB — BPAM RBC
Blood Product Expiration Date: 202103032359
ISSUE DATE / TIME: 202102031150
Unit Type and Rh: 1700

## 2019-07-30 MED ORDER — SODIUM CHLORIDE 0.45 % IV SOLN: INTRAVENOUS | Status: DC

## 2019-07-30 NOTE — Progress Notes (Signed)
PROGRESS NOTE    Frances Gardner  [id number] DOB: 04-14-1927 DOA: 07/27/2019 PCP: Gareth Morgan, MD    Brief Narrative:  Frances Gardner a 86 y.o.femalewith medical history significant ofParkinson's disease with related dementia, chronic anemia with recorded HGB of 7.9. Admitted Jan 2020 for GI bleed-negative EGD, did not have colonoscopy, + bleeding scan but bleeding stopped before any procedure. Diagnosis - presumed diverticular bleed. Patient was report by SNF staff to have had blood per rectum. She was referred to [facility] for further evaluatio    Consultants:   GI  Procedures: Flex sig  Antimicrobials:   None  Subjective: Pt not awaiting the prep.  Nursing yesterday was not able to place NG tube.  Patient has no complaints.  While I was standing there patient had dark stool in the bed. Denies abd pain, nausea.  Objective: Vitals:   07/29/19 1549 07/29/19 1558 07/30/19 0601 07/30/19 0849  BP: (!) 150/84 125/63 101/80 115/71  Pulse: 80 85 80 72  Resp:  14 17 18   Temp:   97.7 F (36.5 C) 99 F (37.2 C)  TempSrc:  Temporal Oral Oral  SpO2: 100% 100% 92% 100%  Weight:      Height:        Intake/Output Summary (Last 24 hours) at 07/30/2019 1618 Last data filed at 07/30/2019 1300 Gross per 24 hour  Intake 2651.67 ml  Output --  Net 2651.67 ml   Filed Weights   07/27/19 1448 07/27/19 1622 07/27/19 2037  Weight: (!) 169 kg 90.7 kg 77.8 kg    Examination:  General exam: Appears calm and comfortable , nad, tries to open eyes to talk Respiratory system: Clear to auscultation anteriorly with poor respiratory effort normal.no wheezing Cardiovascular system: S1 & S2 heard, RRR. No JVD, murmurs, rubs, gallops or clicks.  Gastrointestinal system: Abdomen is nondistended, soft and nontender.  Normal bowel sounds heard. Central nervous system: Alert and oriented. No focal neurological deficits. Extremities: no edema Skin: warm, dry Psychiatry:  Mood & affect  appropriate, quiete.     Data Reviewed: I have personally reviewed following labs and imaging studies  CBC: Recent Labs  Lab 07/27/19 1458 07/27/19 1743 07/28/19 0008 07/28/19 0622 07/29/19 0344 07/29/19 1656 07/30/19 0925  WBC 5.0  --   --  4.4  --   --  7.5  NEUTROABS 2.6  --   --   --   --   --   --   HGB 8.6*   < > 8.0* 8.8* 6.7* 7.9* 7.5*  HCT 28.7*   < > 26.8* 29.9* 22.8* 25.5* 24.1*  MCV 87.2  --   --  87.9  --   --  88.0  PLT 159  --   --  161  --   --  135*   < > = values in this interval not displayed.   Basic Metabolic Panel: Recent Labs  Lab 07/27/19 1458  NA 141  K 4.0  CL 108  CO2 27  GLUCOSE 108*  BUN 24*  CREATININE 0.53  CALCIUM 8.6*   GFR: Estimated Creatinine Clearance: 51.3 mL/min (by C-G formula based on SCr of 0.53 mg/dL). Liver Function Tests: Recent Labs  Lab 07/27/19 1458  AST 11*  ALT <5  ALKPHOS 44  BILITOT 0.4  PROT 6.1*  ALBUMIN 3.1*   No results for input(s): LIPASE, AMYLASE in the last 168 hours. No results for input(s): AMMONIA in the last 168 hours. Coagulation Profile: Recent Labs  Lab 07/27/19 1458  INR 1.2   Cardiac Enzymes: No results for input(s): CKTOTAL, CKMB, CKMBINDEX, TROPONINI in the last 168 hours. BNP (last 3 results) No results for input(s): PROBNP in the last 8760 hours. HbA1C: No results for input(s): HGBA1C in the last 72 hours. CBG: Recent Labs  Lab 07/30/19 0313  GLUCAP 85   Lipid Profile: No results for input(s): CHOL, HDL, LDLCALC, TRIG, CHOLHDL, LDLDIRECT in the last 72 hours. Thyroid Function Tests: No results for input(s): TSH, T4TOTAL, FREET4, T3FREE, THYROIDAB in the last 72 hours. Anemia Panel: Recent Labs    07/28/19 0622 07/28/19 1624 07/28/19 1625  VITAMINB12  --  494  --   FOLATE  --   --  7.7  FERRITIN 27  --   --    Sepsis Labs: No results for input(s): PROCALCITON, LATICACIDVEN in the last 168 hours.  Recent Results (from the past 240 hour(s))  Respiratory Panel  by RT PCR (Flu A&B, Covid) - Nasopharyngeal Swab     Status: None   Collection Time: 07/27/19  4:22 PM   Specimen: Nasopharyngeal Swab  Result Value Ref Range Status   SARS Coronavirus 2 by RT PCR NEGATIVE NEGATIVE Final    Comment: (NOTE) SARS-CoV-2 target nucleic acids are NOT DETECTED. The SARS-CoV-2 RNA is generally detectable in upper respiratoy specimens during the acute phase of infection. The lowest concentration of SARS-CoV-2 viral copies this assay can detect is 131 copies/mL. A negative result does not preclude SARS-Cov-2 infection and should not be used as the sole basis for treatment or other patient management decisions. A negative result may occur with  improper specimen collection/handling, submission of specimen other than nasopharyngeal swab, presence of viral mutation(s) within the areas targeted by this assay, and inadequate number of viral copies (<131 copies/mL). A negative result must be combined with clinical observations, patient history, and epidemiological information. The expected result is Negative. Fact Sheet for Patients:  PinkCheek.be Fact Sheet for Healthcare Providers:  GravelBags.it This test is not yet ap proved or cleared by the [place] FDA and  has been authorized for detection and/or diagnosis of SARS-CoV-2 by FDA under an Emergency Use Authorization (EUA). This EUA will remain  in effect (meaning this test can be used) for the duration of the COVID-19 declaration under Section 564(b)(1) of the Act, 21 U.S.C. section 360bbb-3(b)(1), unless the authorization is terminated or revoked sooner.    Influenza A by PCR NEGATIVE NEGATIVE Final   Influenza B by PCR NEGATIVE NEGATIVE Final    Comment: (NOTE) The Xpert Xpress SARS-CoV-2/FLU/RSV assay is intended as an aid in  the diagnosis of influenza from Nasopharyngeal swab specimens and  should not be used as a sole basis for treatment.  Nasal washings and  aspirates are unacceptable for Xpert Xpress SARS-CoV-2/FLU/RSV  testing. Fact Sheet for Patients: PinkCheek.be Fact Sheet for Healthcare Providers: GravelBags.it This test is not yet approved or cleared by the [place] FDA and  has been authorized for detection and/or diagnosis of SARS-CoV-2 by  FDA under an Emergency Use Authorization (EUA). This EUA will remain  in effect (meaning this test can be used) for the duration of the  Covid-19 declaration under Section 564(b)(1) of the Act, 21  U.S.C. section 360bbb-3(b)(1), unless the authorization is  terminated or revoked. Performed at [facility], [address] [place], [place] [address]   MRSA PCR Screening     Status: None   Collection Time: 07/27/19  9:18 PM   Specimen: Nasopharyngeal  Result Value Ref  Range Status   MRSA by PCR NEGATIVE NEGATIVE Final    Comment:        The GeneXpert MRSA Assay (FDA approved for NASAL specimens only), is one component of a comprehensive MRSA colonization surveillance program. It is not intended to diagnose MRSA infection nor to guide or monitor treatment for MRSA infections. Performed at [facility], [address] [place], [place] [address]          Radiology Studies: No results found.      Scheduled Meds: . acetaminophen  650 mg Oral Once  . carbidopa-levodopa  2 tablet Oral TID  . latanoprost  1 drop Both Eyes QHS  . polyethylene glycol-electrolytes  4,000 mL Oral Once   Continuous Infusions:  Assessment & Plan:   Active Problems:   Lower GI bleed   Dementia due to Parkinson's disease without behavioral disturbance (HCC)   Parkinson disease ([place])   Hematochezia   1. GI bleed/hematochezia - no active bleedingsince coming to the ED -in ED but did have maroon stools in the vault by ED examiner -Had previous bleed, likely diverticular in nature with  negative EGD. s/p transfuse 1 unit packed red blood cells 07/28/18 GI, Dr.Vanga following, status post flex sig but prep not good.  Plan for colonoscopy. Need to place NGT for prep for possible cscope- however per GI, given advanced dementia and will not be able to tolerate bowel prep orally and her age, quality of life and overall health condition,they will hold off on interventions/cscoping, and rec. palliative care and probably hospice . H/h currently stable, continue to monitor ivf for hydration D/c sennakot  2. Dementia - stable  3. Parkinson's - continue home medsSinemet  4. HTN - stable   5. Glaucoma -continue Xalatan  DVT prophylaxis:SCDs Code Status:full code Family Communication:spoke to grandson [place] updated him.  However his older brother who had surgery today is the one that makes decisions. Disposition Plan:SNF when stable, have consulted palliative care and hospice.  Unfortunately need to get hold of health proxy which I am not sure who it is but the older grandson makes his decision and he is having surgery today and durum somewhere.    LOS: 2 days   Time spent: 45 min with >50% coc    Nolberto Hanlon, MD [facility] Pager [phone]  If 7PM-7AM, please contact night-coverage [email or web address] Password [email or web address] 07/30/2019, 4:18 PM

## 2019-07-30 NOTE — Consult Note (Signed)
Consultation Note Date: 07/30/2019   Patient Name: Frances Gardner  DOB: December 23, 1926  MRN: [id number]  Age / Sex: 86 y.o., female  PCP: Gareth Morgan, MD Referring Physician: Nolberto Hanlon, MD  Reason for Consultation: Establishing goals of care  HPI/Patient Profile: 86 y.o. female  with past medical history of Parkinson's disease, dementia, hypertension, hypothyroidism, glaucoma admitted on 07/27/2019 from SNF with bleeding. Poor candidate for colonoscopy given advanced dementia unable to tolerate prep.   Clinical Assessment and Goals of Care: I met today at Ms. Deasis's bedside and she is very sleepy. She does speak with me and answer my questions but does not open her eyes and not very talkative. I have reviewed her MOST and out of facility DNR on shadow chart. She is followed by [facility] at facility.   I met this afternoon with Ms. Herd and her grandson/HCPOA, [place], at bedside. [place] confirms that she had made decision for DNR and NO feeding tube and he respects her issues but also wants to "keep her around as long as possible. This afternoon she is very alert with eyes bright and open and able to track and much more talkative and much more appropriate in conversation. She recognizes her grandson and is able to hold a good conversation. She confirms that she would desire to pursue colonoscopy intervention and grandson agrees. I do feel that she is much improved from my visit earlier in the day. I believe she could tolerate colonoscopy prep in her current state.   All questions/concerns addressed. Emotional support provided. Updated Dr. Kurtis Bushman and Dr. Marius Ditch.   Primary Decision Maker HCPOA grandson [place]    SUMMARY OF RECOMMENDATIONS   - Goals are clear - Pursue GI interventions and treat the treatable - Limits set with DNR and NO feeding tube  Code Status/Advance Care Planning:  DNR   Symptom  Management:   Per attending and GI  Palliative Prophylaxis:   Aspiration, Bowel Regimen, Delirium Protocol, Oral Care and Turn Reposition  Additional Recommendations (Limitations, Scope, Preferences):  No Artificial Feeding  Psycho-social/Spiritual:   Desire for further Chaplaincy support:no  Additional Recommendations: Caregiving  Support/Resources  Prognosis:   Unable to determine  Discharge Planning: Return to SNF when stable.      Primary Diagnoses: Present on Admission: . Dementia due to Parkinson's disease without behavioral disturbance ([place]) . Hematochezia   I have reviewed the medical record, interviewed the patient and family, and examined the patient. The following aspects are pertinent.  Past Medical History:  Diagnosis Date  . Dementia ([place])   . Hypothyroidism   . Parkinson's disease [facility])    Social History   Socioeconomic History  . Marital status: Unknown    Spouse name: Not on file  . Number of children: Not on file  . Years of education: Not on file  . Highest education level: Not on file  Occupational History  . Not on file  Tobacco Use  . Smoking status: Never Smoker  . Smokeless tobacco: Never Used  Substance and  Sexual Activity  . Alcohol use: Not on file  . Drug use: Not on file  . Sexual activity: Not on file  Other Topics Concern  . Not on file  Social History Narrative  . Not on file   Social Determinants of Health   Financial Resource Strain:   . Difficulty of Paying Living Expenses: Not on file  Food Insecurity:   . Worried About Charity fundraiser in the Last Year: Not on file  . Ran Out of Food in the Last Year: Not on file  Transportation Needs:   . Lack of Transportation (Medical): Not on file  . Lack of Transportation (Non-Medical): Not on file  Physical Activity:   . Days of Exercise per Week: Not on file  . Minutes of Exercise per Session: Not on file  Stress:   . Feeling of Stress : Not on file  Social  Connections:   . Frequency of Communication with Friends and Family: Not on file  . Frequency of Social Gatherings with Friends and Family: Not on file  . Attends Religious Services: Not on file  . Active Member of Clubs or Organizations: Not on file  . Attends Archivist Meetings: Not on file  . Marital Status: Not on file   History reviewed. No pertinent family history. Scheduled Meds: . acetaminophen  650 mg Oral Once  . carbidopa-levodopa  2 tablet Oral TID  . latanoprost  1 drop Both Eyes QHS  . polyethylene glycol-electrolytes  4,000 mL Oral Once   Continuous Infusions: PRN Meds:.acetaminophen, morphine CONCENTRATE, polyvinyl alcohol, senna Allergies  Allergen Reactions  . Penicillins   . Shellfish-Derived Products    Review of Systems  Constitutional: Positive for fatigue.  Respiratory: Negative for shortness of breath.   Gastrointestinal: Negative for abdominal pain, nausea and vomiting.  Psychiatric/Behavioral: Positive for sleep disturbance.    Physical Exam Vitals and nursing note reviewed.  Constitutional:      Appearance: She is ill-appearing.  Cardiovascular:     Rate and Rhythm: Normal rate.  Pulmonary:     Effort: Pulmonary effort is normal. No tachypnea, accessory muscle usage or respiratory distress.  Abdominal:     General: Abdomen is flat.     Palpations: Abdomen is soft.     Tenderness: There is no abdominal tenderness.  Neurological:     Mental Status: She is alert.     Comments: Mostly oriented     Vital Signs: BP 115/71 (BP Location: Left Arm)   Pulse 72   Temp 99 F (37.2 C) (Oral)   Resp 18   Ht 5' 8"  (1.727 m)   Wt 77.8 kg   SpO2 100%   BMI 26.08 kg/m  Pain Scale: PAINAD   Pain Score: 0-No pain   SpO2: SpO2: 100 % O2 Device:SpO2: 100 % O2 Flow Rate: .O2 Flow Rate (L/min): 2 L/min  IO: Intake/output summary:   Intake/Output Summary (Last 24 hours) at 07/30/2019 1427 Last data filed at 07/30/2019 1300 Gross per 24  hour  Intake 2651.67 ml  Output --  Net 2651.67 ml    LBM: Last BM Date: 07/30/19 Baseline Weight: Weight: (!) 169 kg Most recent weight: Weight: 77.8 kg     Palliative Assessment/Data:     Time In/Out: 0900-0920, 1430-1500 Time Total: 50 min Greater than 50%  of this time was spent counseling and coordinating care related to the above assessment and plan.  Signed by: Vinie Sill, NP Palliative Medicine Team Pager # [phone] (  M-F 8a-5p) Team Phone # [phone] (Nights/Weekends)

## 2019-07-30 NOTE — Progress Notes (Signed)
Cephas Darby, MD [address]  [place]  [place], [place] [address]  Main: [phone]  Fax: [phone] Pager: [phone]   Subjective: Patient did not do prep yesterday.  Underwent flexible sigmoidoscopy which revealed dark maroon blood mixed with stool, procedure was aborted due to poor prep.  No acute events overnight, received 1 unit of PRBCs and responded appropriately, hemoglobin today is 7.5 Per patient's nurse, she had 2 dark bloody bowel movements   Objective: Vital signs in last 24 hours: Vitals:   07/29/19 1549 07/29/19 1558 07/30/19 0601 07/30/19 0849  BP: (!) 150/84 125/63 101/80 115/71  Pulse: 80 85 80 72  Resp:  14 17 18   Temp:   97.7 F (36.5 C) 99 F (37.2 C)  TempSrc:  Temporal Oral Oral  SpO2: 100% 100% 92% 100%  Weight:      Height:       Weight change:   Intake/Output Summary (Last 24 hours) at 07/30/2019 1321 Last data filed at 07/30/2019 0932 Gross per 24 hour  Intake 2271.67 ml  Output --  Net 2271.67 ml     Exam: Heart:: Regular rate and rhythm, S1S2 present or without murmur or extra heart sounds Lungs: normal and clear to auscultation Abdomen: soft, nontender, normal bowel sounds   Lab Results: CBC Latest Ref Rng & Units 07/30/2019 07/29/2019 07/29/2019  WBC 4.0 - 10.5 K/uL 7.5 - -  Hemoglobin 12.0 - 15.0 g/dL 7.5(L) 7.9(L) 6.7(L)  Hematocrit 36.0 - 46.0 % 24.1(L) 25.5(L) 22.8(L)  Platelets 150 - 400 K/uL 135(L) - -   CMP Latest Ref Rng & Units 07/27/2019 07/02/2018 07/01/2018  Glucose 70 - 99 mg/dL 108(H) 91 103(H)  BUN 8 - 23 mg/dL 24(H) 26(H) 26(H)  Creatinine 0.44 - 1.00 mg/dL 0.53 0.59 0.60  Sodium 135 - 145 mmol/L 141 145 143  Potassium 3.5 - 5.1 mmol/L 4.0 3.5 3.2(L)  Chloride 98 - 111 mmol/L 108 120(H) 119(H)  CO2 22 - 32 mmol/L 27 21(L) 22  Calcium 8.9 - 10.3 mg/dL 8.6(L) 7.9(L) 7.7(L)  Total Protein 6.5 - 8.1 g/dL 6.1(L) - -  Total Bilirubin 0.3 - 1.2 mg/dL 0.4 - -  Alkaline Phos 38 - 126 U/L 44 - -  AST 15 - 41  U/L 11(L) - -  ALT 0 - 44 U/L <5 - -    Micro Results: Recent Results (from the past 240 hour(s))  Respiratory Panel by RT PCR (Flu A&B, Covid) - Nasopharyngeal Swab     Status: None   Collection Time: 07/27/19  4:22 PM   Specimen: Nasopharyngeal Swab  Result Value Ref Range Status   SARS Coronavirus 2 by RT PCR NEGATIVE NEGATIVE Final    Comment: (NOTE) SARS-CoV-2 target nucleic acids are NOT DETECTED. The SARS-CoV-2 RNA is generally detectable in upper respiratoy specimens during the acute phase of infection. The lowest concentration of SARS-CoV-2 viral copies this assay can detect is 131 copies/mL. A negative result does not preclude SARS-Cov-2 infection and should not be used as the sole basis for treatment or other patient management decisions. A negative result may occur with  improper specimen collection/handling, submission of specimen other than nasopharyngeal swab, presence of viral mutation(s) within the areas targeted by this assay, and inadequate number of viral copies (<131 copies/mL). A negative result must be combined with clinical observations, patient history, and epidemiological information. The expected result is Negative. Fact Sheet for Patients:  PinkCheek.be Fact Sheet for Healthcare Providers:  GravelBags.it This test is not  yet ap proved or cleared by the [place] and  has been authorized for detection and/or diagnosis of SARS-CoV-2 by FDA under an Emergency Use Authorization (EUA). This EUA will remain  in effect (meaning this test can be used) for the duration of the COVID-19 declaration under Section 564(b)(1) of the Act, 21 U.S.C. section 360bbb-3(b)(1), unless the authorization is terminated or revoked sooner.    Influenza A by PCR NEGATIVE NEGATIVE Final   Influenza B by PCR NEGATIVE NEGATIVE Final    Comment: (NOTE) The Xpert Xpress SARS-CoV-2/FLU/RSV assay is intended as an aid  in  the diagnosis of influenza from Nasopharyngeal swab specimens and  should not be used as a sole basis for treatment. Nasal washings and  aspirates are unacceptable for Xpert Xpress SARS-CoV-2/FLU/RSV  testing. Fact Sheet for Patients: PinkCheek.be Fact Sheet for Healthcare Providers: GravelBags.it This test is not yet approved or cleared by the [place] FDA and  has been authorized for detection and/or diagnosis of SARS-CoV-2 by  FDA under an Emergency Use Authorization (EUA). This EUA will remain  in effect (meaning this test can be used) for the duration of the  Covid-19 declaration under Section 564(b)(1) of the Act, 21  U.S.C. section 360bbb-3(b)(1), unless the authorization is  terminated or revoked. Performed at [facility], [address] [place], [place] [address]   MRSA PCR Screening     Status: None   Collection Time: 07/27/19  9:18 PM   Specimen: Nasopharyngeal  Result Value Ref Range Status   MRSA by PCR NEGATIVE NEGATIVE Final    Comment:        The GeneXpert MRSA Assay (FDA approved for NASAL specimens only), is one component of a comprehensive MRSA colonization surveillance program. It is not intended to diagnose MRSA infection nor to guide or monitor treatment for MRSA infections. Performed at [facility], [address], [place], [place] [address]    Studies/Results: No results found. Medications:  I have reviewed the patient's current medications. Prior to Admission:  Medications Prior to Admission  Medication Sig Dispense Refill Last Dose  . acetaminophen (TYLENOL) 650 MG CR tablet Take 650 mg by mouth every 8 (eight) hours as needed for pain.   07/27/2019 at 0827  . carbidopa-levodopa (SINEMET IR) 25-100 MG tablet Take 2 tablets by mouth 3 (three) times daily.   07/27/2019 at 1007  . cholecalciferol (VITAMIN D) 25 MCG (1000 UT) tablet Take 1,000 Units by mouth daily.    07/27/2019 at [place]  . latanoprost (XALATAN) 0.005 % ophthalmic solution Place 1 drop into both eyes at bedtime.    07/26/2019 at 2015  . loperamide (IMODIUM) 2 MG capsule Take 2-4 mg by mouth See admin instructions. Take 2 capsules (4mg ) by mouth after first loose stool and then take 1 capsule (2mg ) by mouth after each subsequent loose stool - max 8 capsules in 24 hours   Unknown at PRN  . mineral oil-hydrophilic petrolatum (AQUAPHOR) ointment Apply 1 application topically Nightly.   07/26/2019 at 2058  . Morphine Sulfate (MORPHINE CONCENTRATE) 10 mg / 0.5 ml concentrated solution Take 4 mg by mouth every 2 (two) hours as needed for severe pain or shortness of breath.   Unknown at PRN  . Polyethyl Glycol-Propyl Glycol (SYSTANE) 0.4-0.3 % SOLN Place 1 drop into both eyes 2 (two) times daily as needed (dry eyes).   Unknown at PRN  . senna (SENOKOT) 8.6 MG TABS tablet Take 8.6 mg by mouth 2 (two) times  daily as needed for mild constipation.   07/27/2019 at [place]  . Zinc Oxide (DESITIN) 13 % CREA Apply 1 application topically 3 (three) times daily. (after toileting)   07/27/2019 at 1011  . acetaminophen (TYLENOL) 325 MG tablet Take 650 mg by mouth every 6 (six) hours as needed.   prn at prn  . guaiFENesin-dextromethorphan (ROBITUSSIN DM) 100-10 MG/5ML syrup Take 5 mLs by mouth every 4 (four) hours as needed for cough.   Completed Course at Unknown time  . naloxone (NARCAN) nasal spray 4 mg/0.1 mL Place 1 spray into the nose.   prn at prn   Scheduled: . acetaminophen  650 mg Oral Once  . carbidopa-levodopa  2 tablet Oral TID  . latanoprost  1 drop Both Eyes QHS  . polyethylene glycol-electrolytes  4,000 mL Oral Once   Continuous:  [id number], morphine CONCENTRATE, polyvinyl alcohol, senna Anti-infectives (From admission, onward)   None     Scheduled Meds: . acetaminophen  650 mg Oral Once  . carbidopa-levodopa  2 tablet Oral TID  . latanoprost  1 drop Both Eyes QHS  . polyethylene  glycol-electrolytes  4,000 mL Oral Once   Continuous Infusions: PRN Meds:.acetaminophen, morphine CONCENTRATE, polyvinyl alcohol, senna   Assessment: Active Problems:   Lower GI bleed   Dementia due to Parkinson's disease without behavioral disturbance (HCC)   Parkinson disease ([place])   Hematochezia  Patient presented with hematochezia, s/p 1 unit of PRBCs with appropriate response Flexible sigmoidoscopy was incomplete due to poor prep  Plan: Patient has advanced dementia and will not be able to tolerate bowel prep orally Given her age, quality of life and overall health condition, I think it is appropriate to consult palliative care and probably hospice rather than proceeding with further invasive interventions to figure out the source of GI bleed. If patient's healthcare power of attorney decides to proceed with further work-up, patient will need NG tube placement for bowel prep in order to perform colonoscopy.  If colonoscopy is unremarkable, she will need video capsule endoscopy to evaluate small bowel source Recommend palliative care consult We will follow patient peripherally until her goals of care are addressed   LOS: 2 days   Mahalie Kanner 07/30/2019, 1:21 PM

## 2019-07-31 DIAGNOSIS — Z7189 Other specified counseling: Secondary | ICD-10-CM

## 2019-07-31 DIAGNOSIS — Z515 Encounter for palliative care: Secondary | ICD-10-CM

## 2019-07-31 LAB — CBC
HCT: 27.1 % — ABNORMAL LOW (ref 36.0–46.0)
Hemoglobin: 8.4 g/dL — ABNORMAL LOW (ref 12.0–15.0)
MCH: 27 pg (ref 26.0–34.0)
MCHC: 31 g/dL (ref 30.0–36.0)
MCV: 87.1 fL (ref 80.0–100.0)
Platelets: 94 10*3/uL — ABNORMAL LOW (ref 150–400)
RBC: 3.11 MIL/uL — ABNORMAL LOW (ref 3.87–5.11)
RDW: 14.9 % (ref 11.5–15.5)
WBC: 7 10*3/uL (ref 4.0–10.5)
nRBC: 0 % (ref 0.0–0.2)

## 2019-07-31 MED ORDER — POLYETHYLENE GLYCOL 3350 17 GM/SCOOP PO POWD
1.0000 | Freq: Once | ORAL | Status: AC
  Administered 2019-07-31: 255 g via ORAL
  Filled 2019-07-31 (×2): qty 255

## 2019-07-31 MED ORDER — MAGNESIUM CITRATE PO SOLN
1.0000 | Freq: Once | ORAL | Status: AC
  Administered 2019-07-31: 18:00:00 1 via ORAL
  Filled 2019-07-31 (×2): qty 296

## 2019-07-31 NOTE — TOC Progression Note (Signed)
Transition of Care [facility]) - Progression Note    Patient Details  Name: Frances [place] MRN: [id number] Date of Birth: Jul 17, 1926  Transition of Care [facility]) CM/SW La Bolt, RN Phone Number: 07/31/2019, 4:13 PM  Clinical Narrative:     Patient from [place], [place] to follow, [place] healthcare Proxy [phone], Unable to do the colonoscopy due to unable to tolerate prep, [facility] was called to discuss DC plan and they stated that the patient will return to [place], Palliative following       Expected Discharge Plan and Services                                                 Social Determinants of Health (SDOH) Interventions    Readmission Risk Interventions No flowsheet data found.

## 2019-07-31 NOTE — Progress Notes (Signed)
Cephas Darby, MD [address]  [place]  [place], [place] [address]  Main: [phone]  Fax: [phone] Pager: [phone]   Subjective: Patient is alert and awake, coherent, tolerated full meals today.  Patient's grandson is bedside.  According to palliative care who saw patient and her family today "She has a MOST form and grandson confirms DNR and NO feeding tube. She is a new person this afternoon. Wide awake and conversing with me appropriately. She fed herself lunch and had 100%. They both would want to pursue GI interventions as needed at this stage. Grandson understands that there may be a time when she may be too ill for this interventions but they are interested in pursuing at this time as needed".  Objective: Vital signs in last 24 hours: Vitals:   07/30/19 0849 07/30/19 1718 07/30/19 2253 07/31/19 0834  BP: 115/71 (!) 114/35 (!) 114/58 102/60  Pulse: 72 83 79 70  Resp: 18 18 17 18   Temp: 99 F (37.2 C) 99.1 F (37.3 C) 98.4 F (36.9 C) (!) 97.5 F (36.4 C)  TempSrc: Oral Oral Oral   SpO2: 100% 97% 100% 99%  Weight:      Height:       Weight change:  No intake or output data in the 24 hours ending 07/31/19 1837   Exam: Heart:: Regular rate and rhythm, S1S2 present or without murmur or extra heart sounds Lungs: normal and clear to auscultation Abdomen: soft, nontender, normal bowel sounds   Lab Results: CBC Latest Ref Rng & Units 07/31/2019 07/30/2019 07/29/2019  WBC 4.0 - 10.5 K/uL 7.0 7.5 -  Hemoglobin 12.0 - 15.0 g/dL 8.4(L) 7.5(L) 7.9(L)  Hematocrit 36.0 - 46.0 % 27.1(L) 24.1(L) 25.5(L)  Platelets 150 - 400 K/uL 94(L) 135(L) -   CMP Latest Ref Rng & Units 07/27/2019 07/02/2018 07/01/2018  Glucose 70 - 99 mg/dL 108(H) 91 103(H)  BUN 8 - 23 mg/dL 24(H) 26(H) 26(H)  Creatinine 0.44 - 1.00 mg/dL 0.53 0.59 0.60  Sodium 135 - 145 mmol/L 141 145 143  Potassium 3.5 - 5.1 mmol/L 4.0 3.5 3.2(L)  Chloride 98 - 111 mmol/L 108 120(H) 119(H)  CO2 22 - 32  mmol/L 27 21(L) 22  Calcium 8.9 - 10.3 mg/dL 8.6(L) 7.9(L) 7.7(L)  Total Protein 6.5 - 8.1 g/dL 6.1(L) - -  Total Bilirubin 0.3 - 1.2 mg/dL 0.4 - -  Alkaline Phos 38 - 126 U/L 44 - -  AST 15 - 41 U/L 11(L) - -  ALT 0 - 44 U/L <5 - -    Micro Results: Recent Results (from the past 240 hour(s))  Respiratory Panel by RT PCR (Flu A&B, Covid) - Nasopharyngeal Swab     Status: None   Collection Time: 07/27/19  4:22 PM   Specimen: Nasopharyngeal Swab  Result Value Ref Range Status   SARS Coronavirus 2 by RT PCR NEGATIVE NEGATIVE Final    Comment: (NOTE) SARS-CoV-2 target nucleic acids are NOT DETECTED. The SARS-CoV-2 RNA is generally detectable in upper respiratoy specimens during the acute phase of infection. The lowest concentration of SARS-CoV-2 viral copies this assay can detect is 131 copies/mL. A negative result does not preclude SARS-Cov-2 infection and should not be used as the sole basis for treatment or other patient management decisions. A negative result may occur with  improper specimen collection/handling, submission of specimen other than nasopharyngeal swab, presence of viral mutation(s) within the areas targeted by this assay, and inadequate number of viral copies (<131  copies/mL). A negative result must be combined with clinical observations, patient history, and epidemiological information. The expected result is Negative. Fact Sheet for Patients:  PinkCheek.be Fact Sheet for Healthcare Providers:  GravelBags.it This test is not yet ap proved or cleared by the [place] FDA and  has been authorized for detection and/or diagnosis of SARS-CoV-2 by FDA under an Emergency Use Authorization (EUA). This EUA will remain  in effect (meaning this test can be used) for the duration of the COVID-19 declaration under Section 564(b)(1) of the Act, 21 U.S.C. section 360bbb-3(b)(1), unless the authorization is terminated  or revoked sooner.    Influenza A by PCR NEGATIVE NEGATIVE Final   Influenza B by PCR NEGATIVE NEGATIVE Final    Comment: (NOTE) The Xpert Xpress SARS-CoV-2/FLU/RSV assay is intended as an aid in  the diagnosis of influenza from Nasopharyngeal swab specimens and  should not be used as a sole basis for treatment. Nasal washings and  aspirates are unacceptable for Xpert Xpress SARS-CoV-2/FLU/RSV  testing. Fact Sheet for Patients: PinkCheek.be Fact Sheet for Healthcare Providers: GravelBags.it This test is not yet approved or cleared by the [place] FDA and  has been authorized for detection and/or diagnosis of SARS-CoV-2 by  FDA under an Emergency Use Authorization (EUA). This EUA will remain  in effect (meaning this test can be used) for the duration of the  Covid-19 declaration under Section 564(b)(1) of the Act, 21  U.S.C. section 360bbb-3(b)(1), unless the authorization is  terminated or revoked. Performed at [facility], [address] [place], [place] [address]   MRSA PCR Screening     Status: None   Collection Time: 07/27/19  9:18 PM   Specimen: Nasopharyngeal  Result Value Ref Range Status   MRSA by PCR NEGATIVE NEGATIVE Final    Comment:        The GeneXpert MRSA Assay (FDA approved for NASAL specimens only), is one component of a comprehensive MRSA colonization surveillance program. It is not intended to diagnose MRSA infection nor to guide or monitor treatment for MRSA infections. Performed at [facility], [address] [place], [place] [address]    Studies/Results: No results found. Medications:  I have reviewed the patient's current medications. Prior to Admission:  Medications Prior to Admission  Medication Sig Dispense Refill Last Dose  . acetaminophen (TYLENOL) 650 MG CR tablet Take 650 mg by mouth every 8 (eight) hours as needed for pain.   07/27/2019 at 0827  .  carbidopa-levodopa (SINEMET IR) 25-100 MG tablet Take 2 tablets by mouth 3 (three) times daily.   07/27/2019 at 1007  . cholecalciferol (VITAMIN D) 25 MCG (1000 UT) tablet Take 1,000 Units by mouth daily.   07/27/2019 at [place]  . latanoprost (XALATAN) 0.005 % ophthalmic solution Place 1 drop into both eyes at bedtime.    07/26/2019 at 2015  . loperamide (IMODIUM) 2 MG capsule Take 2-4 mg by mouth See admin instructions. Take 2 capsules (4mg ) by mouth after first loose stool and then take 1 capsule (2mg ) by mouth after each subsequent loose stool - max 8 capsules in 24 hours   Unknown at PRN  . mineral oil-hydrophilic petrolatum (AQUAPHOR) ointment Apply 1 application topically Nightly.   07/26/2019 at 2058  . Morphine Sulfate (MORPHINE CONCENTRATE) 10 mg / 0.5 ml concentrated solution Take 4 mg by mouth every 2 (two) hours as needed for severe pain or shortness of breath.   Unknown at PRN  . Polyethyl Glycol-Propyl Glycol (SYSTANE) 0.4-0.3 %  SOLN Place 1 drop into both eyes 2 (two) times daily as needed (dry eyes).   Unknown at PRN  . senna (SENOKOT) 8.6 MG TABS tablet Take 8.6 mg by mouth 2 (two) times daily as needed for mild constipation.   07/27/2019 at [place]  . Zinc Oxide (DESITIN) 13 % CREA Apply 1 application topically 3 (three) times daily. (after toileting)   07/27/2019 at 1011  . acetaminophen (TYLENOL) 325 MG tablet Take 650 mg by mouth every 6 (six) hours as needed.   prn at prn  . guaiFENesin-dextromethorphan (ROBITUSSIN DM) 100-10 MG/5ML syrup Take 5 mLs by mouth every 4 (four) hours as needed for cough.   Completed Course at Unknown time  . naloxone (NARCAN) nasal spray 4 mg/0.1 mL Place 1 spray into the nose.   prn at prn   Scheduled: . acetaminophen  650 mg Oral Once  . carbidopa-levodopa  2 tablet Oral TID  . latanoprost  1 drop Both Eyes QHS  . polyethylene glycol powder  1 Container Oral Once   Continuous: . sodium chloride 50 mL/hr at 07/31/19 1500   [id number], morphine  CONCENTRATE, polyvinyl alcohol Anti-infectives (From admission, onward)   None     Scheduled Meds: . acetaminophen  650 mg Oral Once  . carbidopa-levodopa  2 tablet Oral TID  . latanoprost  1 drop Both Eyes QHS  . polyethylene glycol powder  1 Container Oral Once   Continuous Infusions: . sodium chloride 50 mL/hr at 07/31/19 1500   PRN Meds:.acetaminophen, morphine CONCENTRATE, polyvinyl alcohol   Assessment: Active Problems:   Lower GI bleed   Dementia due to Parkinson's disease without behavioral disturbance ([place])   Parkinson disease ([place])   Hematochezia  Patient presented with hematochezia, s/p 1 unit of PRBCs with appropriate response Flexible sigmoidoscopy was incomplete due to poor prep No active bleeding at this time, hemoglobin is uptrending  Plan: Patient has advanced dementia and was unable to tolerate bowel prep previously Attempted to place NG tube was not successful As patient is more coherent and eating by mouth and per wishes of the patient and her grandson will attempt colonoscopy again Clear liquid diet We will try MiraLAX prep today Will perform colonoscopy when her stools are clear N.p.o. past midnight Dr. Vicente Males will assume care for the weekend   LOS: 3 days   Frances Gardner 07/31/2019, 6:37 PM

## 2019-07-31 NOTE — Progress Notes (Signed)
PROGRESS NOTE    Theone Digiacomo  [id number] DOB: 11-23-1926 DOA: 07/27/2019 PCP: Gareth Morgan, MD    Brief Narrative:  [name] a 86 y.o.femalewith medical history significant ofParkinson's disease with related dementia, chronic anemia with recorded HGB of 7.9. Admitted Jan 2020 for GI bleed-negative EGD, did not have colonoscopy, + bleeding scan but bleeding stopped before any procedure. Diagnosis - presumed diverticular bleed. Patient was report by SNF staff to have had blood per rectum. She was referred to [facility] for further evaluatio    Consultants:   GI  Procedures: Flex sig  Antimicrobials:   None  Subjective: Patient earlier today.  Little bit more interactive but not too much more still quite.  Did not want to eat breakfast that much and she reports "she was full" . No other complaints.   Objective: Vitals:   07/30/19 0849 07/30/19 1718 07/30/19 2253 07/31/19 0834  BP: 115/71 (!) 114/35 (!) 114/58 102/60  Pulse: 72 83 79 70  Resp: 18 18 17 18   Temp: 99 F (37.2 C) 99.1 F (37.3 C) 98.4 F (36.9 C) (!) 97.5 F (36.4 C)  TempSrc: Oral Oral Oral   SpO2: 100% 97% 100% 99%  Weight:      Height:        Intake/Output Summary (Last 24 hours) at 07/31/2019 1755 Last data filed at 07/30/2019 1816 Gross per 24 hour  Intake 780 ml  Output -  Net 780 ml   Filed Weights   07/27/19 1448 07/27/19 1622 07/27/19 2037  Weight: (!) 169 kg 90.7 kg 77.8 kg    Examination:  General exam: Appears calm and comfortable , nad, little more interactive this am. NT at bedside Respiratory system: Clear to auscultation anteriorly with poor respiratory effort normal.no wheezing or rhonchi Cardiovascular system: S1 & S2 heard, RRR. No , murmurs, rubs, gallops or clicks.  Gastrointestinal system: Abdomen is nondistended, soft and nontender.  Normal bowel sounds heard. Central nervous system: Awake. Grossly intact without any change Extremities: no edema Skin:  warm, dry Psychiatry:  Mood & affect appropriate in current setting    Data Reviewed: I have personally reviewed following labs and imaging studies  CBC: Recent Labs  Lab 07/27/19 1458 07/27/19 1743 07/28/19 0622 07/29/19 0344 07/29/19 1656 07/30/19 0925 07/31/19 0651  WBC 5.0  --  4.4  --   --  7.5 7.0  NEUTROABS 2.6  --   --   --   --   --   --   HGB 8.6*   < > 8.8* 6.7* 7.9* 7.5* 8.4*  HCT 28.7*   < > 29.9* 22.8* 25.5* 24.1* 27.1*  MCV 87.2  --  87.9  --   --  88.0 87.1  PLT 159  --  161  --   --  135* 94*   < > = values in this interval not displayed.   Basic Metabolic Panel: Recent Labs  Lab 07/27/19 1458  NA 141  K 4.0  CL 108  CO2 27  GLUCOSE 108*  BUN 24*  CREATININE 0.53  CALCIUM 8.6*   GFR: Estimated Creatinine Clearance: 51.3 mL/min (by C-G formula based on SCr of 0.53 mg/dL). Liver Function Tests: Recent Labs  Lab 07/27/19 1458  AST 11*  ALT <5  ALKPHOS 44  BILITOT 0.4  PROT 6.1*  ALBUMIN 3.1*   No results for input(s): LIPASE, AMYLASE in the last 168 hours. No results for input(s): AMMONIA in the last 168 hours. Coagulation Profile: Recent Labs  Lab 07/27/19 1458  INR 1.2   Cardiac Enzymes: No results for input(s): CKTOTAL, CKMB, CKMBINDEX, TROPONINI in the last 168 hours. BNP (last 3 results) No results for input(s): PROBNP in the last 8760 hours. HbA1C: No results for input(s): HGBA1C in the last 72 hours. CBG: Recent Labs  Lab 07/30/19 0313  GLUCAP 85   Lipid Profile: No results for input(s): CHOL, HDL, LDLCALC, TRIG, CHOLHDL, LDLDIRECT in the last 72 hours. Thyroid Function Tests: No results for input(s): TSH, T4TOTAL, FREET4, T3FREE, THYROIDAB in the last 72 hours. Anemia Panel: No results for input(s): VITAMINB12, FOLATE, FERRITIN, TIBC, IRON, RETICCTPCT in the last 72 hours. Sepsis Labs: No results for input(s): PROCALCITON, LATICACIDVEN in the last 168 hours.  Recent Results (from the past 240 hour(s))  Respiratory  Panel by RT PCR (Flu A&B, Covid) - Nasopharyngeal Swab     Status: None   Collection Time: 07/27/19  4:22 PM   Specimen: Nasopharyngeal Swab  Result Value Ref Range Status   SARS Coronavirus 2 by RT PCR NEGATIVE NEGATIVE Final    Comment: (NOTE) SARS-CoV-2 target nucleic acids are NOT DETECTED. The SARS-CoV-2 RNA is generally detectable in upper respiratoy specimens during the acute phase of infection. The lowest concentration of SARS-CoV-2 viral copies this assay can detect is 131 copies/mL. A negative result does not preclude SARS-Cov-2 infection and should not be used as the sole basis for treatment or other patient management decisions. A negative result may occur with  improper specimen collection/handling, submission of specimen other than nasopharyngeal swab, presence of viral mutation(s) within the areas targeted by this assay, and inadequate number of viral copies (<131 copies/mL). A negative result must be combined with clinical observations, patient history, and epidemiological information. The expected result is Negative. Fact Sheet for Patients:  PinkCheek.be Fact Sheet for Healthcare Providers:  GravelBags.it This test is not yet ap proved or cleared by the [place] FDA and  has been authorized for detection and/or diagnosis of SARS-CoV-2 by FDA under an Emergency Use Authorization (EUA). This EUA will remain  in effect (meaning this test can be used) for the duration of the COVID-19 declaration under Section 564(b)(1) of the Act, 21 U.S.C. section 360bbb-3(b)(1), unless the authorization is terminated or revoked sooner.    Influenza A by PCR NEGATIVE NEGATIVE Final   Influenza B by PCR NEGATIVE NEGATIVE Final    Comment: (NOTE) The Xpert Xpress SARS-CoV-2/FLU/RSV assay is intended as an aid in  the diagnosis of influenza from Nasopharyngeal swab specimens and  should not be used as a sole basis for  treatment. Nasal washings and  aspirates are unacceptable for Xpert Xpress SARS-CoV-2/FLU/RSV  testing. Fact Sheet for Patients: PinkCheek.be Fact Sheet for Healthcare Providers: GravelBags.it This test is not yet approved or cleared by the [place] FDA and  has been authorized for detection and/or diagnosis of SARS-CoV-2 by  FDA under an Emergency Use Authorization (EUA). This EUA will remain  in effect (meaning this test can be used) for the duration of the  Covid-19 declaration under Section 564(b)(1) of the Act, 21  U.S.C. section 360bbb-3(b)(1), unless the authorization is  terminated or revoked. Performed at [facility], [address] [place], [place] [address]   MRSA PCR Screening     Status: None   Collection Time: 07/27/19  9:18 PM   Specimen: Nasopharyngeal  Result Value Ref Range Status   MRSA by PCR NEGATIVE NEGATIVE Final    Comment:  The GeneXpert MRSA Assay (FDA approved for NASAL specimens only), is one component of a comprehensive MRSA colonization surveillance program. It is not intended to diagnose MRSA infection nor to guide or monitor treatment for MRSA infections. Performed at [facility], [address] [place], [place] [address]          Radiology Studies: No results found.      Scheduled Meds: . acetaminophen  650 mg Oral Once  . carbidopa-levodopa  2 tablet Oral TID  . latanoprost  1 drop Both Eyes QHS  . polyethylene glycol powder  1 Container Oral Once   Continuous Infusions: . sodium chloride 50 mL/hr at 07/31/19 1500    Assessment & Plan:   Active Problems:   Lower GI bleed   Dementia due to Parkinson's disease without behavioral disturbance (HCC)   Parkinson disease ([place])   Hematochezia   1. GI bleed/hematochezia - no active bleedingsince coming to the ED -in ED but did have maroon stools in the vault by ED examiner -Had  previous bleed, likely diverticular in nature with negative EGD. s/p transfuse 1 unit packed red blood cells 07/28/18 GI, Dr.Vanga following, status post flex sig but prep not good.  Plan for colonoscopy. Need to place NGT for prep for possible cscope- however per GI, given advanced dementia and will not be able to tolerate bowel prep orally and her age, quality of life and overall health condition,they will hold off on interventions/cscoping, and rec. palliative care and probably hospice . H/h currently stable, continue to monitor 2/5- spoke to health proxy grandson, who reports pt is more interactive and wants to proceed with colonoscopy.  He also stated he spoke to GI doctor Dr. Marius Ditch and would like to proceed with this. Per GI, cscope in am if she drinks preps.  2. Dementia - stable  3. Parkinson's - continue home medsSinemet  4. HTN - stable   5. Glaucoma -continue Xalatan  DVT prophylaxis:SCDs Code Status:full code Family Communication:spoke to grandson and health proxy [place] at [phone]. disposition Plan:Return to SNF once colonoscopy is completed.  C-scope possibly tomorrow if prep completed by patient  LOS: 3 days   Time spent: 45 min with >50% coc    Nolberto Hanlon, MD [facility] Pager [phone]  If 7PM-7AM, please contact night-coverage [email or web address] Password [facility] 07/31/2019, 5:55 PM Patient ID: Sarde Lene, female   DOB: Jan 10, 1927, 86 y.o.   MRN: [id number]

## 2019-08-01 DIAGNOSIS — E876 Hypokalemia: Secondary | ICD-10-CM

## 2019-08-01 LAB — BASIC METABOLIC PANEL
Anion gap: 5 (ref 5–15)
BUN: 8 mg/dL (ref 8–23)
CO2: 27 mmol/L (ref 22–32)
Calcium: 8.5 mg/dL — ABNORMAL LOW (ref 8.9–10.3)
Chloride: 111 mmol/L (ref 98–111)
Creatinine, Ser: 0.49 mg/dL (ref 0.44–1.00)
GFR calc Af Amer: >60 mL/min (ref >60)
GFR calc non Af Amer: >60 mL/min (ref >60)
Glucose, Bld: 88 mg/dL (ref 70–99)
Potassium: 3.2 mmol/L — ABNORMAL LOW (ref 3.5–5.1)
Sodium: 143 mmol/L (ref 135–145)

## 2019-08-01 LAB — PREPARE RBC (CROSSMATCH)

## 2019-08-01 LAB — HEMOGLOBIN AND HEMATOCRIT, BLOOD
HCT: 22.1 % — ABNORMAL LOW (ref 36.0–46.0)
Hemoglobin: 6.7 g/dL — ABNORMAL LOW (ref 12.0–15.0)

## 2019-08-01 MED ORDER — PEG 3350-KCL-NA BICARB-NACL 420 G PO SOLR
4000.0000 mL | Freq: Once | ORAL | Status: AC
  Administered 2019-08-01: 15:00:00 4000 mL via ORAL
  Filled 2019-08-01: qty 4000

## 2019-08-01 MED ORDER — CHLORHEXIDINE GLUCONATE 0.12 % MT SOLN
15.0000 mL | Freq: Two times a day (BID) | OROMUCOSAL | Status: DC
  Administered 2019-08-01 – 2019-08-05 (×8): 15 mL via OROMUCOSAL
  Filled 2019-08-01 (×8): qty 15

## 2019-08-01 MED ORDER — POTASSIUM CHLORIDE 10 MEQ/100ML IV SOLN
10.0000 meq | INTRAVENOUS | Status: AC
  Administered 2019-08-01 – 2019-08-02 (×4): 10 meq via INTRAVENOUS
  Filled 2019-08-01 (×4): qty 100

## 2019-08-01 MED ORDER — LACTATED RINGERS IV BOLUS
250.0000 mL | Freq: Once | INTRAVENOUS | Status: AC
  Administered 2019-08-01: 17:00:00 250 mL via INTRAVENOUS

## 2019-08-01 MED ORDER — SODIUM CHLORIDE 0.9% IV SOLUTION: Freq: Once | INTRAVENOUS | Status: AC

## 2019-08-01 MED ORDER — ORAL CARE MOUTH RINSE
15.0000 mL | Freq: Two times a day (BID) | OROMUCOSAL | Status: DC
  Administered 2019-08-01 – 2019-08-05 (×7): 15 mL via OROMUCOSAL

## 2019-08-01 MED ORDER — POTASSIUM CHLORIDE 10 MEQ/100ML IV SOLN: 10.0000 meq | INTRAVENOUS | Status: DC

## 2019-08-01 MED ORDER — SODIUM CHLORIDE 0.9 % IV SOLN
INTRAVENOUS | Status: DC | PRN
  Administered 2019-08-01 – 2019-08-02 (×4): 250 mL via INTRAVENOUS

## 2019-08-01 MED ORDER — ACETAMINOPHEN 325 MG PO TABS
650.0000 mg | ORAL_TABLET | Freq: Once | ORAL | Status: AC
  Administered 2019-08-01: 22:00:00 650 mg via ORAL
  Filled 2019-08-01: qty 2

## 2019-08-01 NOTE — Progress Notes (Signed)
PROGRESS NOTE    Frances Gardner  [id number] DOB: 1927/02/12 DOA: 07/27/2019 PCP: Frances Morgan, MD    Brief Narrative:  Frances Gardner a 86 y.o.femalewith medical history significant ofParkinson's disease with related dementia, chronic anemia with recorded HGB of 7.9. Admitted Jan 2020 for GI bleed-negative EGD, did not have colonoscopy, + bleeding scan but bleeding stopped before any procedure. Diagnosis - presumed diverticular bleed. Patient was report by SNF staff to have had blood per rectum. She was referred to [facility] for further evaluatio    Consultants:   GI  Procedures: Flex sig  Antimicrobials:   None  Subjective: Patient is interactive today.  Has no complaints.  She did not finish the prep for colonoscopy yesterday.  Per nursing patient had brown stool this a.m.   Objective: Vitals:   07/30/19 2253 07/31/19 0834 07/31/19 2331 08/01/19 0736  BP: (!) 114/58 102/60 (!) 104/55 (!) 125/52  Pulse: 79 70 67 (!) 56  Resp: 17 18 16 18   Temp: 98.4 F (36.9 C) (!) 97.5 F (36.4 C) 99 F (37.2 C) 98.9 F (37.2 C)  TempSrc: Oral   Oral  SpO2: 100% 99% 94% 99%  Weight:      Height:        Intake/Output Summary (Last 24 hours) at 08/01/2019 1157 Last data filed at 08/01/2019 0300 Gross per 24 hour  Intake 1567.84 ml  Output --  Net 1567.84 ml   Filed Weights   07/27/19 1448 07/27/19 1622 07/27/19 2037  Weight: (!) 169 kg 90.7 kg 77.8 kg    Examination:  General exam: Appears calm and comfortable , lying in bed.  Interactive Respiratory system: Clear to auscultation anteriorly with poor respiratory effort normal.no wheezing or rhonchi Cardiovascular system: S1 & S2 heard, RRR. No , murmurs, rubs, gallops or clicks.  Gastrointestinal system: Abdomen is nondistended, soft and nontender.  Normal bowel sounds heard. Central nervous system: Awake and alert Extremities: no edema or cyanosis Skin: warm, dry Psychiatry:  Mood & affect appropriate in  current setting    Data Reviewed: I have personally reviewed following labs and imaging studies  CBC: Recent Labs  Lab 07/27/19 1458 07/27/19 1743 07/28/19 0622 07/29/19 0344 07/29/19 1656 07/30/19 0925 07/31/19 0651  WBC 5.0  --  4.4  --   --  7.5 7.0  NEUTROABS 2.6  --   --   --   --   --   --   HGB 8.6*   < > 8.8* 6.7* 7.9* 7.5* 8.4*  HCT 28.7*   < > 29.9* 22.8* 25.5* 24.1* 27.1*  MCV 87.2  --  87.9  --   --  88.0 87.1  PLT 159  --  161  --   --  135* 94*   < > = values in this interval not displayed.   Basic Metabolic Panel: Recent Labs  Lab 07/27/19 1458 08/01/19 0633  NA 141 143  K 4.0 3.2*  CL 108 111  CO2 27 27  GLUCOSE 108* 88  BUN 24* 8  CREATININE 0.53 0.49  CALCIUM 8.6* 8.5*   GFR: Estimated Creatinine Clearance: 51.3 mL/min (by C-G formula based on SCr of 0.49 mg/dL). Liver Function Tests: Recent Labs  Lab 07/27/19 1458  AST 11*  ALT <5  ALKPHOS 44  BILITOT 0.4  PROT 6.1*  ALBUMIN 3.1*   No results for input(s): LIPASE, AMYLASE in the last 168 hours. No results for input(s): AMMONIA in the last 168 hours. Coagulation Profile: Recent Labs  Lab 07/27/19 1458  INR 1.2   Cardiac Enzymes: No results for input(s): CKTOTAL, CKMB, CKMBINDEX, TROPONINI in the last 168 hours. BNP (last 3 results) No results for input(s): PROBNP in the last 8760 hours. HbA1C: No results for input(s): HGBA1C in the last 72 hours. CBG: Recent Labs  Lab 07/30/19 0313  GLUCAP 85   Lipid Profile: No results for input(s): CHOL, HDL, LDLCALC, TRIG, CHOLHDL, LDLDIRECT in the last 72 hours. Thyroid Function Tests: No results for input(s): TSH, T4TOTAL, FREET4, T3FREE, THYROIDAB in the last 72 hours. Anemia Panel: No results for input(s): VITAMINB12, FOLATE, FERRITIN, TIBC, IRON, RETICCTPCT in the last 72 hours. Sepsis Labs: No results for input(s): PROCALCITON, LATICACIDVEN in the last 168 hours.  Recent Results (from the past 240 hour(s))  Respiratory Panel  by RT PCR (Flu A&B, Covid) - Nasopharyngeal Swab     Status: None   Collection Time: 07/27/19  4:22 PM   Specimen: Nasopharyngeal Swab  Result Value Ref Range Status   SARS Coronavirus 2 by RT PCR NEGATIVE NEGATIVE Final    Comment: (NOTE) SARS-CoV-2 target nucleic acids are NOT DETECTED. The SARS-CoV-2 RNA is generally detectable in upper respiratoy specimens during the acute phase of infection. The lowest concentration of SARS-CoV-2 viral copies this assay can detect is 131 copies/mL. A negative result does not preclude SARS-Cov-2 infection and should not be used as the sole basis for treatment or other patient management decisions. A negative result may occur with  improper specimen collection/handling, submission of specimen other than nasopharyngeal swab, presence of viral mutation(s) within the areas targeted by this assay, and inadequate number of viral copies (<131 copies/mL). A negative result must be combined with clinical observations, patient history, and epidemiological information. The expected result is Negative. Fact Sheet for Patients:  PinkCheek.be Fact Sheet for Healthcare Providers:  GravelBags.it This test is not yet ap proved or cleared by the [place] FDA and  has been authorized for detection and/or diagnosis of SARS-CoV-2 by FDA under an Emergency Use Authorization (EUA). This EUA will remain  in effect (meaning this test can be used) for the duration of the COVID-19 declaration under Section 564(b)(1) of the Act, 21 U.S.C. section 360bbb-3(b)(1), unless the authorization is terminated or revoked sooner.    Influenza A by PCR NEGATIVE NEGATIVE Final   Influenza B by PCR NEGATIVE NEGATIVE Final    Comment: (NOTE) The Xpert Xpress SARS-CoV-2/FLU/RSV assay is intended as an aid in  the diagnosis of influenza from Nasopharyngeal swab specimens and  should not be used as a sole basis for treatment.  Nasal washings and  aspirates are unacceptable for Xpert Xpress SARS-CoV-2/FLU/RSV  testing. Fact Sheet for Patients: PinkCheek.be Fact Sheet for Healthcare Providers: GravelBags.it This test is not yet approved or cleared by the [place] FDA and  has been authorized for detection and/or diagnosis of SARS-CoV-2 by  FDA under an Emergency Use Authorization (EUA). This EUA will remain  in effect (meaning this test can be used) for the duration of the  Covid-19 declaration under Section 564(b)(1) of the Act, 21  U.S.C. section 360bbb-3(b)(1), unless the authorization is  terminated or revoked. Performed at [facility], [address] [place], [place] [address]   MRSA PCR Screening     Status: None   Collection Time: 07/27/19  9:18 PM   Specimen: Nasopharyngeal  Result Value Ref Range Status   MRSA by PCR NEGATIVE NEGATIVE Final    Comment:  The GeneXpert MRSA Assay (FDA approved for NASAL specimens only), is one component of a comprehensive MRSA colonization surveillance program. It is not intended to diagnose MRSA infection nor to guide or monitor treatment for MRSA infections. Performed at [facility], [address] [place], [place] [address]          Radiology Studies: No results found.      Scheduled Meds: . acetaminophen  650 mg Oral Once  . carbidopa-levodopa  2 tablet Oral TID  . chlorhexidine  15 mL Mouth Rinse BID  . latanoprost  1 drop Both Eyes QHS  . mouth rinse  15 mL Mouth Rinse q12n4p   Continuous Infusions: . sodium chloride 50 mL/hr at 08/01/19 0300  . sodium chloride 250 mL (08/01/19 0850)  . potassium chloride 10 mEq (08/01/19 0853)    Assessment & Plan:   Active Problems:   Lower GI bleed   Dementia due to Parkinson's disease without behavioral disturbance (HCC)   Parkinson disease ([place])   Hematochezia   Goals of care, counseling/discussion    Palliative care encounter   1. GI bleed/hematochezia - no active bleedingsince coming to the ED -in ED but did have maroon stools in the vault by ED examiner -Had previous bleed, likely diverticular in nature with negative EGD. s/p transfuse 1 unit packed red blood cells 07/28/18 GI, Dr.Vanga following, status post flex sig but prep not good.  Plan for colonoscopy. Need to place NGT for prep for possible cscope- however per GI, given advanced dementia and will not be able to tolerate bowel prep orally and her age, quality of life and overall health condition,they will hold off on interventions/cscoping, and rec. palliative care and probably hospice . H/h currently stable, continue to monitor 2/5- spoke to health proxy grandson, who reports pt is more interactive and wants to proceed with colonoscopy.  He also stated he spoke to GI doctor Dr. Marius Ditch and would like to proceed with this. Plan: cscope cancelled today as pt did not finish taking her prep yesterday. Plan for cscope in am if she completes her prep  2. Dementia - stable  3. Parkinson's - continue home medsSinemet  4. HTN - stable   5. Glaucoma -continue Xalatan  6.  Hypokalemia-replace Monitor levels  DVT prophylaxis:SCDs Code Status:full code Family Communication:for contact need to call grandson and health proxy [place] at [phone].  Disposition Plan:Return to SNF once colonoscopy is completed.  C-scope possibly tomorrow if prep completed by patient   LOS: 4 days   Time spent: 45 min with >50% coc    Nolberto Hanlon, MD [facility] Pager [phone]  If 7PM-7AM, please contact night-coverage [email or web address] Password [facility] 08/01/2019, 11:57 AM Patient ID: Frances Gardner, female   DOB: 06/10/1927, 86 y.o.   MRN: [id number]

## 2019-08-01 NOTE — Progress Notes (Signed)
Informed this morning that she has not completed the bowel prep and she still having brown stool.  I have informed Dr. Kurtis Bushman the procedure be canceled and can be reattempted tomorrow if cleaned out.  Dr Jonathon Bellows MD,MRCP [facility]) Gastroenterology/Hepatology Pager: [phone]

## 2019-08-01 NOTE — Progress Notes (Signed)
Mews score 3 due to hypotension and tachycardia.  Dr Kurtis Bushman made aware.  265ml LR bolus given with minimal improvement.  No change in mental status. Pt answers appropriately.  Pt had 2 large dark brown stools during the shift.  Currently Hgb 6.7. 2xunits of blood pending.  Plan for colonoscopy tomorrow. Pt drunk about half of the prep so far.

## 2019-08-02 DIAGNOSIS — E876 Hypokalemia: Secondary | ICD-10-CM

## 2019-08-02 LAB — TYPE AND SCREEN
ABO/RH(D): B POS
Antibody Screen: NEGATIVE
Unit division: 0
Unit division: 0

## 2019-08-02 LAB — CBC
HCT: 32.5 % — ABNORMAL LOW (ref 36.0–46.0)
Hemoglobin: 10.1 g/dL — ABNORMAL LOW (ref 12.0–15.0)
MCH: 28 pg (ref 26.0–34.0)
MCHC: 31.1 g/dL (ref 30.0–36.0)
MCV: 90 fL (ref 80.0–100.0)
Platelets: 150 10*3/uL (ref 150–400)
RBC: 3.61 MIL/uL — ABNORMAL LOW (ref 3.87–5.11)
RDW: 15.7 % — ABNORMAL HIGH (ref 11.5–15.5)
WBC: 6.1 10*3/uL (ref 4.0–10.5)
nRBC: 0.3 % — ABNORMAL HIGH (ref 0.0–0.2)

## 2019-08-02 LAB — BPAM RBC
Blood Product Expiration Date: 202103102359
Blood Product Expiration Date: 202103112359
ISSUE DATE / TIME: 202102062033
ISSUE DATE / TIME: 202102062218
Unit Type and Rh: 7300
Unit Type and Rh: 7300

## 2019-08-02 LAB — POTASSIUM: Potassium: 3.5 mmol/L (ref 3.5–5.1)

## 2019-08-02 MED ORDER — PEG 3350-KCL-NA BICARB-NACL 420 G PO SOLR
2000.0000 mL | Freq: Once | ORAL | Status: AC
  Administered 2019-08-02: 18:00:00 2000 mL via ORAL
  Filled 2019-08-02: qty 4000

## 2019-08-02 NOTE — Anesthesia Preprocedure Evaluation (Addendum)
Anesthesia Evaluation  Patient identified by MRN, date of birth, ID band Patient awake    Reviewed: Allergy & Precautions, H&P , NPO status , Patient's Chart, lab work & pertinent test results  History of Anesthesia Complications Negative for: history of anesthetic complications  Airway Mallampati: III       Dental  (+) Dental Advidsory Given   Pulmonary neg pulmonary ROS, Patient abstained from smoking.,           Cardiovascular (-) Past MI negative cardio ROS       Neuro/Psych PSYCHIATRIC DISORDERS Dementia Parkinson's disease    GI/Hepatic negative GI ROS, Neg liver ROS,   Endo/Other  neg diabetesHypothyroidism   Renal/GU negative Renal ROS  negative genitourinary   Musculoskeletal   Abdominal   Peds  Hematology  (+) Blood dyscrasia, anemia ,   Anesthesia Other Findings Past Medical History: No date: Dementia ([place]) No date: Hypothyroidism No date: Parkinson's disease [facility])  Past Surgical History: 07/03/2018: COLONOSCOPY WITH PROPOFOL; N/A     Comment:  Procedure: COLONOSCOPY WITH PROPOFOL;  Surgeon: Lin Landsman, MD;  Location: [facility] ENDOSCOPY;  Service:               Gastroenterology;  Laterality: N/A; 07/03/2018: ESOPHAGOGASTRODUODENOSCOPY (EGD) WITH PROPOFOL; N/A     Comment:  Procedure: ESOPHAGOGASTRODUODENOSCOPY (EGD) WITH               PROPOFOL;  Surgeon: Lin Landsman, MD;  Location:               [facility] ENDOSCOPY;  Service: Gastroenterology;  Laterality:               N/A; 07/29/2019: FLEXIBLE SIGMOIDOSCOPY; N/A     Comment:  Procedure: FLEXIBLE SIGMOIDOSCOPY;  Surgeon: Lin Landsman, MD;  Location: [facility] ENDOSCOPY;  Service:               Gastroenterology;  Laterality: N/A; 06/30/2018: IR ANGIOGRAM VISCERAL SELECTIVE  BMI    Body Mass Index: 26.08 kg/m      Reproductive/Obstetrics negative OB ROS                            Anesthesia Physical Anesthesia Plan  ASA: III  Anesthesia Plan: General   Post-op Pain Management:    Induction:   PONV Risk Score and Plan: Propofol infusion and TIVA  Airway Management Planned: Natural Airway and Simple Face Mask  Additional Equipment:   Intra-op Plan:   Post-operative Plan:   Informed Consent: I have reviewed the patients History and Physical, chart, labs and discussed the procedure including the risks, benefits and alternatives for the proposed anesthesia with the patient or authorized representative who has indicated his/her understanding and acceptance.    Suspend DNR.   Dental Advisory Given  Plan Discussed with: Anesthesiologist  Anesthesia Plan Comments: (As patient has dementia, consent for anesthesia was obtained from grandson [place].  Discussed risks of anesthesia.  DNR will be suspended perioperatively in accordance with his wishes.  KR)       Anesthesia Quick Evaluation

## 2019-08-02 NOTE — Progress Notes (Signed)
PROGRESS NOTE    Frances Gardner  [id number] DOB: 1926-11-22 DOA: 07/27/2019 PCP: Gareth Morgan, MD    Brief Narrative:  [name] a 86 y.o.femalewith medical history significant ofParkinson's disease with related dementia, chronic anemia with recorded HGB of 7.9. Admitted Jan 2020 for GI bleed-negative EGD, did not have colonoscopy, + bleeding scan but bleeding stopped before any procedure. Diagnosis - presumed diverticular bleed. Patient was report by SNF staff to have had blood per rectum. She was referred to [facility] for further evaluatio    Consultants:   GI  Procedures: Flex sig  Antimicrobials:   None  Subjective: Patient was hypotensive after having to dark brown stools yesterday.  Was given bolus of IV fluids.  Transfused 2 units of packed red blood cell with hemodynamic stabilizing today. Patient laying in bed, awake, denies any shortness of breath, chest pain, or any other symptoms.    Objective: Vitals:   08/01/19 2239 08/02/19 0025 08/02/19 0505 08/02/19 0729  BP: (!) 82/54 (!) 90/56 125/69 120/75  Pulse: 68 72 69 68  Resp: 16 15 16 16   Temp: 98.6 F (37 C) 98.3 F (36.8 C) 98.6 F (37 C) 99.1 F (37.3 C)  TempSrc: Axillary Axillary Oral Oral  SpO2: 100% 100% 100% 94%  Weight:      Height:        Intake/Output Summary (Last 24 hours) at 08/02/2019 1323 Last data filed at 08/02/2019 0759 Gross per 24 hour  Intake 2297.53 ml  Output --  Net 2297.53 ml   Filed Weights   07/27/19 1448 07/27/19 1622 07/27/19 2037  Weight: (!) 169 kg 90.7 kg 77.8 kg    Examination:  General exam: Appears calm and comfortable , lying in bed.  Interactive Respiratory system: Clear to auscultation anteriorly with poor respiratory effort normal.no wheezing or rhonchi Cardiovascular system: S1 & S2 heard, RRR. No , murmurs, rubs, gallops or clicks.  Gastrointestinal system: Abdomen is nondistended, soft and nontender.  Normal bowel sounds heard. Central  nervous system: Awake and alert Extremities: no edema or cyanosis Skin: warm, dry Psychiatry:  Mood & affect appropriate in current setting    Data Reviewed: I have personally reviewed following labs and imaging studies  CBC: Recent Labs  Lab 07/27/19 1458 07/27/19 1743 07/28/19 0622 07/29/19 0344 07/29/19 1656 07/30/19 0925 07/31/19 0651 08/01/19 1719 08/02/19 0741  WBC 5.0  --  4.4  --   --  7.5 7.0  --  6.1  NEUTROABS 2.6  --   --   --   --   --   --   --   --   HGB 8.6*   < > 8.8*   < > 7.9* 7.5* 8.4* 6.7* 10.1*  HCT 28.7*   < > 29.9*   < > 25.5* 24.1* 27.1* 22.1* 32.5*  MCV 87.2  --  87.9  --   --  88.0 87.1  --  90.0  PLT 159  --  161  --   --  135* 94*  --  150   < > = values in this interval not displayed.   Basic Metabolic Panel: Recent Labs  Lab 07/27/19 1458 08/01/19 0633 08/02/19 0741  NA 141 143  --   K 4.0 3.2* 3.5  CL 108 111  --   CO2 27 27  --   GLUCOSE 108* 88  --   BUN 24* 8  --   CREATININE 0.53 0.49  --   CALCIUM 8.6* 8.5*  --  GFR: Estimated Creatinine Clearance: 51.3 mL/min (by C-G formula based on SCr of 0.49 mg/dL). Liver Function Tests: Recent Labs  Lab 07/27/19 1458  AST 11*  ALT <5  ALKPHOS 44  BILITOT 0.4  PROT 6.1*  ALBUMIN 3.1*   No results for input(s): LIPASE, AMYLASE in the last 168 hours. No results for input(s): AMMONIA in the last 168 hours. Coagulation Profile: Recent Labs  Lab 07/27/19 1458  INR 1.2   Cardiac Enzymes: No results for input(s): CKTOTAL, CKMB, CKMBINDEX, TROPONINI in the last 168 hours. BNP (last 3 results) No results for input(s): PROBNP in the last 8760 hours. HbA1C: No results for input(s): HGBA1C in the last 72 hours. CBG: Recent Labs  Lab 07/30/19 0313  GLUCAP 85   Lipid Profile: No results for input(s): CHOL, HDL, LDLCALC, TRIG, CHOLHDL, LDLDIRECT in the last 72 hours. Thyroid Function Tests: No results for input(s): TSH, T4TOTAL, FREET4, T3FREE, THYROIDAB in the last 72  hours. Anemia Panel: No results for input(s): VITAMINB12, FOLATE, FERRITIN, TIBC, IRON, RETICCTPCT in the last 72 hours. Sepsis Labs: No results for input(s): PROCALCITON, LATICACIDVEN in the last 168 hours.  Recent Results (from the past 240 hour(s))  Respiratory Panel by RT PCR (Flu A&B, Covid) - Nasopharyngeal Swab     Status: None   Collection Time: 07/27/19  4:22 PM   Specimen: Nasopharyngeal Swab  Result Value Ref Range Status   SARS Coronavirus 2 by RT PCR NEGATIVE NEGATIVE Final    Comment: (NOTE) SARS-CoV-2 target nucleic acids are NOT DETECTED. The SARS-CoV-2 RNA is generally detectable in upper respiratoy specimens during the acute phase of infection. The lowest concentration of SARS-CoV-2 viral copies this assay can detect is 131 copies/mL. A negative result does not preclude SARS-Cov-2 infection and should not be used as the sole basis for treatment or other patient management decisions. A negative result may occur with  improper specimen collection/handling, submission of specimen other than nasopharyngeal swab, presence of viral mutation(s) within the areas targeted by this assay, and inadequate number of viral copies (<131 copies/mL). A negative result must be combined with clinical observations, patient history, and epidemiological information. The expected result is Negative. Fact Sheet for Patients:  PinkCheek.be Fact Sheet for Healthcare Providers:  GravelBags.it This test is not yet ap proved or cleared by the [place] FDA and  has been authorized for detection and/or diagnosis of SARS-CoV-2 by FDA under an Emergency Use Authorization (EUA). This EUA will remain  in effect (meaning this test can be used) for the duration of the COVID-19 declaration under Section 564(b)(1) of the Act, 21 U.S.C. section 360bbb-3(b)(1), unless the authorization is terminated or revoked sooner.    Influenza A by PCR  NEGATIVE NEGATIVE Final   Influenza B by PCR NEGATIVE NEGATIVE Final    Comment: (NOTE) The Xpert Xpress SARS-CoV-2/FLU/RSV assay is intended as an aid in  the diagnosis of influenza from Nasopharyngeal swab specimens and  should not be used as a sole basis for treatment. Nasal washings and  aspirates are unacceptable for Xpert Xpress SARS-CoV-2/FLU/RSV  testing. Fact Sheet for Patients: PinkCheek.be Fact Sheet for Healthcare Providers: GravelBags.it This test is not yet approved or cleared by the [place] FDA and  has been authorized for detection and/or diagnosis of SARS-CoV-2 by  FDA under an Emergency Use Authorization (EUA). This EUA will remain  in effect (meaning this test can be used) for the duration of the  Covid-19 declaration under Section 564(b)(1) of the Act, 21  U.S.C.  section 360bbb-3(b)(1), unless the authorization is  terminated or revoked. Performed at [facility], [address] [place], [place] [address]   MRSA PCR Screening     Status: None   Collection Time: 07/27/19  9:18 PM   Specimen: Nasopharyngeal  Result Value Ref Range Status   MRSA by PCR NEGATIVE NEGATIVE Final    Comment:        The GeneXpert MRSA Assay (FDA approved for NASAL specimens only), is one component of a comprehensive MRSA colonization surveillance program. It is not intended to diagnose MRSA infection nor to guide or monitor treatment for MRSA infections. Performed at [facility], [address] [place], [place] [address]          Radiology Studies: No results found.      Scheduled Meds: . carbidopa-levodopa  2 tablet Oral TID  . chlorhexidine  15 mL Mouth Rinse BID  . latanoprost  1 drop Both Eyes QHS  . mouth rinse  15 mL Mouth Rinse q12n4p  . polyethylene glycol-electrolytes  2,000 mL Oral Once   Continuous Infusions: . sodium chloride 75 mL/hr at 08/02/19 1124  . sodium  chloride Stopped (08/02/19 0547)    Assessment & Plan:   Active Problems:   Lower GI bleed   Dementia due to Parkinson's disease without behavioral disturbance (HCC)   Parkinson disease ([place])   Hematochezia   Goals of care, counseling/discussion   Palliative care encounter   1. GI bleed/hematochezia - no active bleedingsince coming to the ED -in ED but did have maroon stools in the vault by ED examiner -Had previous bleed, likely diverticular in nature with negative EGD. s/p transfuse 1 unit packed red blood cells 07/28/18 GI, Dr.Vanga following, status post flex sig but prep not good.  Plan for colonoscopy. Need to place NGT for prep for possible cscope- however per GI, given advanced dementia and will not be able to tolerate bowel prep orally and her age, quality of life and overall health condition,they will hold off on interventions/cscoping, and rec. palliative care and probably hospice . H/h currently stable, continue to monitor 2/5- spoke to health proxy grandson, who reports pt is more interactive and wants to proceed with colonoscopy.  He also stated he spoke to GI doctor Dr. Marius Ditch and would like to proceed with this. 2/6 s/p 2 U PRBC transfused with improvement of hemodynamics Plan: cscope cancelled today as unsure if patient is cleaned out well.  GI plans to give more prep this afternoon and plan for colonoscopy in a.m. Continue to monitor h/h  2. Dementia - stable  3. Parkinson's - continue home medsSinemet  4. HTN - stable   5. Glaucoma -continue Xalatan  6.  Hypokalemia-replace, now stable   DVT prophylaxis:SCDs Code Status:full code Family Communication:for contact need to call grandson and health proxy [place] at [phone].  Disposition Plan:Return to SNF once colonoscopy is completed.  C-scope possibly tomorrow if prep completed by patient tonight   LOS: 5 days   Time spent: 45 min with >50% coc    Nolberto Hanlon, MD [facility] Pager [phone]  If 7PM-7AM, please contact night-coverage [email or web address] Password [email or web address] 08/02/2019, 1:23 PM Patient ID: Rozlyn Chuba, female   DOB: 1926-09-20, 86 y.o.   MRN: [id number]

## 2019-08-02 NOTE — Progress Notes (Signed)
Patient was called down for the colonoscopy , still having brown stool./ We called to try and find out if it was luight or dark brown or if clear to see through it, not able to obtain information from the floor as no one had actually seen the stool color .   Felt safer to give her more prep this PM and perform the procedure tomorrow when we are sure she is cleaned out , reducing the risk of repeated anesthesia at her age.   Dr Jonathon Bellows MD,MRCP [facility]) Gastroenterology/Hepatology Pager: [phone]

## 2019-08-03 ENCOUNTER — Inpatient Hospital Stay: Payer: Medicare (Managed Care) | Admitting: Anesthesiology

## 2019-08-03 ENCOUNTER — Encounter
Admission: EM | Disposition: A | Discharge status: Skilled Nursing Facility | Payer: Self-pay | Source: Skilled Nursing Facility | Attending: Internal Medicine

## 2019-08-03 ENCOUNTER — Encounter: Payer: Self-pay | Admitting: Family Medicine

## 2019-08-03 DIAGNOSIS — K922 Gastrointestinal hemorrhage, unspecified: Secondary | ICD-10-CM

## 2019-08-03 HISTORY — PX: COLONOSCOPY: SHX5424

## 2019-08-03 LAB — CBC
HCT: 31.6 % — ABNORMAL LOW (ref 36.0–46.0)
Hemoglobin: 9.8 g/dL — ABNORMAL LOW (ref 12.0–15.0)
MCH: 27.8 pg (ref 26.0–34.0)
MCHC: 31 g/dL (ref 30.0–36.0)
MCV: 89.5 fL (ref 80.0–100.0)
Platelets: 160 10*3/uL (ref 150–400)
RBC: 3.53 MIL/uL — ABNORMAL LOW (ref 3.87–5.11)
RDW: 15.6 % — ABNORMAL HIGH (ref 11.5–15.5)
WBC: 6.5 10*3/uL (ref 4.0–10.5)
nRBC: 0.3 % — ABNORMAL HIGH (ref 0.0–0.2)

## 2019-08-03 LAB — POTASSIUM: Potassium: 2.9 mmol/L — ABNORMAL LOW (ref 3.5–5.1)

## 2019-08-03 LAB — MAGNESIUM: Magnesium: 1.9 mg/dL (ref 1.7–2.4)

## 2019-08-03 SURGERY — COLONOSCOPY
Anesthesia: General

## 2019-08-03 MED ORDER — SODIUM CHLORIDE 0.9 % IV SOLN: INTRAVENOUS | Status: DC

## 2019-08-03 MED ORDER — PROPOFOL 10 MG/ML IV BOLUS
INTRAVENOUS | Status: DC | PRN
  Administered 2019-08-03: 20 mg via INTRAVENOUS

## 2019-08-03 MED ORDER — PROPOFOL 500 MG/50ML IV EMUL
INTRAVENOUS | Status: DC | PRN
  Administered 2019-08-03: 50 ug/kg/min via INTRAVENOUS

## 2019-08-03 MED ORDER — POTASSIUM CHLORIDE 10 MEQ/100ML IV SOLN
10.0000 meq | INTRAVENOUS | Status: AC
  Administered 2019-08-03 (×4): 10 meq via INTRAVENOUS
  Filled 2019-08-03 (×4): qty 100

## 2019-08-03 NOTE — Care Management Important Message (Signed)
Important Message  Patient Details  Name: Frances Gardner MRN: [id number] Date of Birth: 1926/12/18   Medicare Important Message Given:  No  Patient out of the room for a procedure so not able to obtain initial IM signature today.   [name] 08/03/2019, 1:53 PM

## 2019-08-03 NOTE — Op Note (Signed)
[facility] Gastroenterology Patient Name: Rick Moshe Procedure Date: 08/03/2019 8:26 AM MRN: [id number] Account #: [id number] Date of Birth: Feb 05, 1927 Admit Type: Outpatient Age: 86 Room: [facility] ENDO ROOM 4 Gender: Female Note Status: Finalized Procedure:             Colonoscopy Indications:           Hematochezia Providers:             Lucilla Lame MD, MD Referring MD:          Shelly Coss. Coralie Common (Referring MD) Medicines:             Propofol per Anesthesia Complications:         No immediate complications. Procedure:             Pre-Anesthesia Assessment:                        - Prior to the procedure, a History and Physical was                         performed, and patient medications and allergies were                         reviewed. The patient's tolerance of previous                         anesthesia was also reviewed. The risks and benefits                         of the procedure and the sedation options and risks                         were discussed with the patient. All questions were                         answered, and informed consent was obtained. Prior                         Anticoagulants: The patient has taken no previous                         anticoagulant or antiplatelet agents. ASA Grade                         Assessment: III - A patient with severe systemic                         disease. After reviewing the risks and benefits, the                         patient was deemed in satisfactory condition to                         undergo the procedure.                        After obtaining informed consent, the colonoscope was                         passed  under direct vision. Throughout the procedure,                         the patient's blood pressure, pulse, and oxygen                         saturations were monitored continuously. The                         Colonoscope was introduced through the anus and    advanced to the the cecum, identified by appendiceal                         orifice and ileocecal valve. The colonoscopy was                         performed without difficulty. The patient tolerated                         the procedure well. The quality of the bowel                         preparation was poor. Findings:      The perianal and digital rectal examinations were normal.      A frond-like/villous non-obstructing large mass was found in the cecum.       The mass was non-circumferential. Oozing was present. Area was       unsuccessfully injected with 3 mL saline with indigo carmine for a lift       polypectomy. The polyp was removed with a hot snare. Polyp resection was       incomplete. The resected tissue was retrieved. To prevent bleeding       post-intervention, two hemostatic clips were successfully placed (MR       conditional). There was no bleeding at the end of the procedure.      A 4 mm polyp was found in the cecum. The polyp was sessile. The polyp       was removed with a hot snare. Resection and retrieval were complete. Impression:            - Preparation of the colon was poor.                        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 Tissue was removed. Treatment                         not successful. Clips (MR conditional) were placed.                        - One 4 mm polyp in the cecum, removed with a hot                         snare. Resected and retrieved.                        - When a large part of the polyp was removed it showed                         that the lesion involved the  IC valve and was larger                         then first appeared. Further removal was not attempted. Recommendation:        - Return patient to hospital ward for ongoing care.                        - Resume previous diet.                        - Continue present medications. Procedure Code(s):     --- Professional ---                        [phone], Colonoscopy, flexible; with  removal of                         tumor(s), polyp(s), or other lesion(s) by snare                         technique                        [id number], Colonoscopy, flexible; with directed submucosal                         injection(s), any substance Diagnosis Code(s):     --- Professional ---                        K92.1, Melena (includes Hematochezia)                        D49.0, Neoplasm of unspecified behavior of digestive                         system CPT copyright 2019 American Medical Association. All rights reserved. The codes documented in this report are preliminary and upon coder review may  be revised to meet current compliance requirements. Lucilla Lame MD, MD 08/03/2019 11:37:26 AM This report has been signed electronically. Number of Addenda: 0 Note Initiated On: 08/03/2019 8:26 AM Scope Withdrawal Time: 0 hours 14 minutes 45 seconds  Total Procedure Duration: 0 hours 20 minutes 4 seconds  Estimated Blood Loss:  Estimated blood loss: none.      [facility]

## 2019-08-03 NOTE — Anesthesia Procedure Notes (Signed)
Date/Time: 08/03/2019 11:10 AM Performed by: Nelda Marseille, CRNA Pre-anesthesia Checklist: Patient identified, Emergency Drugs available, Suction available, Patient being monitored and Timeout performed Oxygen Delivery Method: Nasal cannula

## 2019-08-03 NOTE — Progress Notes (Signed)
PROGRESS NOTE    Frances Gardner  [id number] DOB: Apr 24, 1927 DOA: 07/27/2019 PCP: Gareth Morgan, MD    Brief Narrative:  Frances Gardner a 86 y.o.femalewith medical history significant ofParkinson's disease with related dementia, chronic anemia with recorded HGB of 7.9. Admitted Jan 2020 for GI bleed-negative EGD, did not have colonoscopy, + bleeding scan but bleeding stopped before any procedure. Diagnosis - presumed diverticular bleed. Patient was report by SNF staff to have had blood per rectum. She was referred to [facility] for further evaluatio    Consultants:   GI  Procedures: Flex sig  Antimicrobials:   None  Subjective: Patient seen this a.m. prior to colonoscopy.  She has no complaints of shortness of breath, dizziness, chest pain.  Per nursing tech patient had some mild light brown with clear liquid stool nothing dark.  No blood reported.  Objective: Vitals:   08/03/19 0949 08/03/19 1049 08/03/19 1134 08/03/19 1154  BP: (!) 150/74 (!) 96/54  (!) 125/102  Pulse: 72 67    Resp: 18 18    Temp: 97.7 F (36.5 C) (!) 97.5 F (36.4 C) 99 F (37.2 C)   TempSrc:  Temporal Temporal   SpO2: 100% 100%    Weight:      Height:        Intake/Output Summary (Last 24 hours) at 08/03/2019 1524 Last data filed at 08/03/2019 0850 Gross per 24 hour  Intake 1171.66 ml  Output --  Net 1171.66 ml   Filed Weights   07/27/19 1448 07/27/19 1622 07/27/19 2037  Weight: (!) 169 kg 90.7 kg 77.8 kg    Examination:  General exam: Appears calm and comfortable , NAD, communicative Respiratory system: Clear to auscultation anteriorly with poor respiratory effort normal, No crackles,  wheezing or rhonchi Cardiovascular system: S1 & S2 heard, RRR. No , murmurs, rubs, gallops or clicks.  Gastrointestinal system: Abdomen is nondistended, soft and nontender.  Normal bowel sounds heard. Central nervous system: Awake and alert Extremities: generalized mild edema. No cyanosis Skin:  warm, dry Psychiatry:  Mood & affect appropriate in current setting    Data Reviewed: I have personally reviewed following labs and imaging studies  CBC: Recent Labs  Lab 07/28/19 0622 07/29/19 0344 07/30/19 0925 07/31/19 0651 08/01/19 1719 08/02/19 0741 08/03/19 0136  WBC 4.4  --  7.5 7.0  --  6.1 6.5  HGB 8.8*   < > 7.5* 8.4* 6.7* 10.1* 9.8*  HCT 29.9*   < > 24.1* 27.1* 22.1* 32.5* 31.6*  MCV 87.9  --  88.0 87.1  --  90.0 89.5  PLT 161  --  135* 94*  --  150 160   < > = values in this interval not displayed.   Basic Metabolic Panel: Recent Labs  Lab 08/01/19 0633 08/02/19 0741 08/03/19 0136  NA 143  --   --   K 3.2* 3.5 2.9*  CL 111  --   --   CO2 27  --   --   GLUCOSE 88  --   --   BUN 8  --   --   CREATININE 0.49  --   --   CALCIUM 8.5*  --   --   MG  --   --  1.9   GFR: Estimated Creatinine Clearance: 51.3 mL/min (by C-G formula based on SCr of 0.49 mg/dL). Liver Function Tests: No results for input(s): AST, ALT, ALKPHOS, BILITOT, PROT, ALBUMIN in the last 168 hours. No results for input(s): LIPASE, AMYLASE in the  last 168 hours. No results for input(s): AMMONIA in the last 168 hours. Coagulation Profile: No results for input(s): INR, PROTIME in the last 168 hours. Cardiac Enzymes: No results for input(s): CKTOTAL, CKMB, CKMBINDEX, TROPONINI in the last 168 hours. BNP (last 3 results) No results for input(s): PROBNP in the last 8760 hours. HbA1C: No results for input(s): HGBA1C in the last 72 hours. CBG: Recent Labs  Lab 07/30/19 0313  GLUCAP 85   Lipid Profile: No results for input(s): CHOL, HDL, LDLCALC, TRIG, CHOLHDL, LDLDIRECT in the last 72 hours. Thyroid Function Tests: No results for input(s): TSH, T4TOTAL, FREET4, T3FREE, THYROIDAB in the last 72 hours. Anemia Panel: No results for input(s): VITAMINB12, FOLATE, FERRITIN, TIBC, IRON, RETICCTPCT in the last 72 hours. Sepsis Labs: No results for input(s): PROCALCITON, LATICACIDVEN in the  last 168 hours.  Recent Results (from the past 240 hour(s))  Respiratory Panel by RT PCR (Flu A&B, Covid) - Nasopharyngeal Swab     Status: None   Collection Time: 07/27/19  4:22 PM   Specimen: Nasopharyngeal Swab  Result Value Ref Range Status   SARS Coronavirus 2 by RT PCR NEGATIVE NEGATIVE Final    Comment: (NOTE) SARS-CoV-2 target nucleic acids are NOT DETECTED. The SARS-CoV-2 RNA is generally detectable in upper respiratoy specimens during the acute phase of infection. The lowest concentration of SARS-CoV-2 viral copies this assay can detect is 131 copies/mL. A negative result does not preclude SARS-Cov-2 infection and should not be used as the sole basis for treatment or other patient management decisions. A negative result may occur with  improper specimen collection/handling, submission of specimen other than nasopharyngeal swab, presence of viral mutation(s) within the areas targeted by this assay, and inadequate number of viral copies (<131 copies/mL). A negative result must be combined with clinical observations, patient history, and epidemiological information. The expected result is Negative. Fact Sheet for Patients:  PinkCheek.be Fact Sheet for Healthcare Providers:  GravelBags.it This test is not yet ap proved or cleared by the [place] FDA and  has been authorized for detection and/or diagnosis of SARS-CoV-2 by FDA under an Emergency Use Authorization (EUA). This EUA will remain  in effect (meaning this test can be used) for the duration of the COVID-19 declaration under Section 564(b)(1) of the Act, 21 U.S.C. section 360bbb-3(b)(1), unless the authorization is terminated or revoked sooner.    Influenza A by PCR NEGATIVE NEGATIVE Final   Influenza B by PCR NEGATIVE NEGATIVE Final    Comment: (NOTE) The Xpert Xpress SARS-CoV-2/FLU/RSV assay is intended as an aid in  the diagnosis of influenza from  Nasopharyngeal swab specimens and  should not be used as a sole basis for treatment. Nasal washings and  aspirates are unacceptable for Xpert Xpress SARS-CoV-2/FLU/RSV  testing. Fact Sheet for Patients: PinkCheek.be Fact Sheet for Healthcare Providers: GravelBags.it This test is not yet approved or cleared by the [place] FDA and  has been authorized for detection and/or diagnosis of SARS-CoV-2 by  FDA under an Emergency Use Authorization (EUA). This EUA will remain  in effect (meaning this test can be used) for the duration of the  Covid-19 declaration under Section 564(b)(1) of the Act, 21  U.S.C. section 360bbb-3(b)(1), unless the authorization is  terminated or revoked. Performed at [facility], [address] [place], [place] [address]   MRSA PCR Screening     Status: None   Collection Time: 07/27/19  9:18 PM   Specimen: Nasopharyngeal  Result Value Ref Range Status  MRSA by PCR NEGATIVE NEGATIVE Final    Comment:        The GeneXpert MRSA Assay (FDA approved for NASAL specimens only), is one component of a comprehensive MRSA colonization surveillance program. It is not intended to diagnose MRSA infection nor to guide or monitor treatment for MRSA infections. Performed at [facility], [address], [place], [place] [address]          Radiology Studies: No results found.      Scheduled Meds: . carbidopa-levodopa  2 tablet Oral TID  . chlorhexidine  15 mL Mouth Rinse BID  . latanoprost  1 drop Both Eyes QHS  . mouth rinse  15 mL Mouth Rinse q12n4p   Continuous Infusions: . sodium chloride 50 mL/hr at 08/03/19 0445  . sodium chloride Stopped (08/02/19 0547)    Assessment & Plan:   Active Problems:   Lower GI bleed   Dementia due to Parkinson's disease without behavioral disturbance (HCC)   Parkinson disease ([place])   Hematochezia   Goals of care,  counseling/discussion   Palliative care encounter   Hypokalemia   1. GI bleed/hematochezia - no active bleedingsince coming to the ED -in ED but did have maroon stools in the vault by ED examiner -Had previous bleed, likely diverticular in nature with negative EGD. s/p transfuse 1 unit packed red blood cells 07/28/18 GI, Dr.Vanga following, status post flex sig but prep not good.  Plan for colonoscopy. Need to place NGT for prep for possible cscope- however per GI, given advanced dementia and will not be able to tolerate bowel prep orally and her age, quality of life and overall health condition,they will hold off on interventions/cscoping, and rec. palliative care and probably hospice . H/h currently stable, continue to monitor 2/5- spoke to health proxy grandson, who reports pt is more interactive and wants to proceed with colonoscopy.  He also stated he spoke to GI doctor Dr. Marius Ditch and would like to proceed with this. 2/6 s/p 2 U PRBC transfused with improvement of hemodynamics Plan: S/p cscope now..>cecum mass, see full report. Spoke to grandson who wants surgery involved , consulted surgery . Monitor h/h Resume diet  2. Dementia - stable  3. Parkinson's - continue home medsSinemet  4. HTN - stable   5. Glaucoma -continue Xalatan  6.  Hypokalemia-replace, now stable   DVT prophylaxis:SCDs Code Status:full code Family Communication:uptdated health proxy [place] at [id number].  Disposition Plan:Return to SNF, once surgery evaluates pt and make a decision on surgery v.s. nothing as pt's grandson wants surgical evaluation.    LOS: 6 days   Time spent: 45 min with >50% coc    Nolberto Hanlon, MD [facility] Pager [phone]  If 7PM-7AM, please contact night-coverage [email or web address] Password [facility] 08/03/2019, 3:24 PM Patient ID: Frances Gardner, female   DOB: 1926/08/27, 86 y.o.   MRN: [id number]

## 2019-08-03 NOTE — Transfer of Care (Signed)
Immediate Anesthesia Transfer of Care Note  Patient: Frances Gardner  Procedure(s) Performed: COLONOSCOPY (N/A )  Patient Location: PACU  Anesthesia Type:General  Level of Consciousness: sedated  Airway & Oxygen Therapy: Patient Spontanous Breathing and Patient connected to nasal cannula oxygen  Post-op Assessment: Report given to RN and Post -op Vital signs reviewed and stable  Post vital signs: Reviewed and stable  Last Vitals:  Vitals Value Taken Time  BP 111/73 08/03/19 1135  Temp 37.2 C 08/03/19 1134  Pulse 78 08/03/19 1136  Resp 19 08/03/19 1136  SpO2 96 % 08/03/19 1136  Vitals shown include unvalidated device data.  Last Pain:  Vitals:   08/03/19 1134  TempSrc: Temporal  PainSc:          Complications: No apparent anesthesia complications

## 2019-08-04 ENCOUNTER — Encounter: Payer: Self-pay | Admitting: *Deleted

## 2019-08-04 LAB — HEMOGLOBIN AND HEMATOCRIT, BLOOD
HCT: 33.3 % — ABNORMAL LOW (ref 36.0–46.0)
Hemoglobin: 10.3 g/dL — ABNORMAL LOW (ref 12.0–15.0)

## 2019-08-04 LAB — POTASSIUM
Potassium: 3 mmol/L — ABNORMAL LOW (ref 3.5–5.1)
Potassium: 3.9 mmol/L (ref 3.5–5.1)

## 2019-08-04 IMAGING — DX DG CHEST 1V PORT
1 series · 1 of 1 positions shown · non-contrast
Comparison: None.

CLINICAL DATA: Central line placement.

EXAM:
PORTABLE CHEST 1 VIEW

[chest ap]
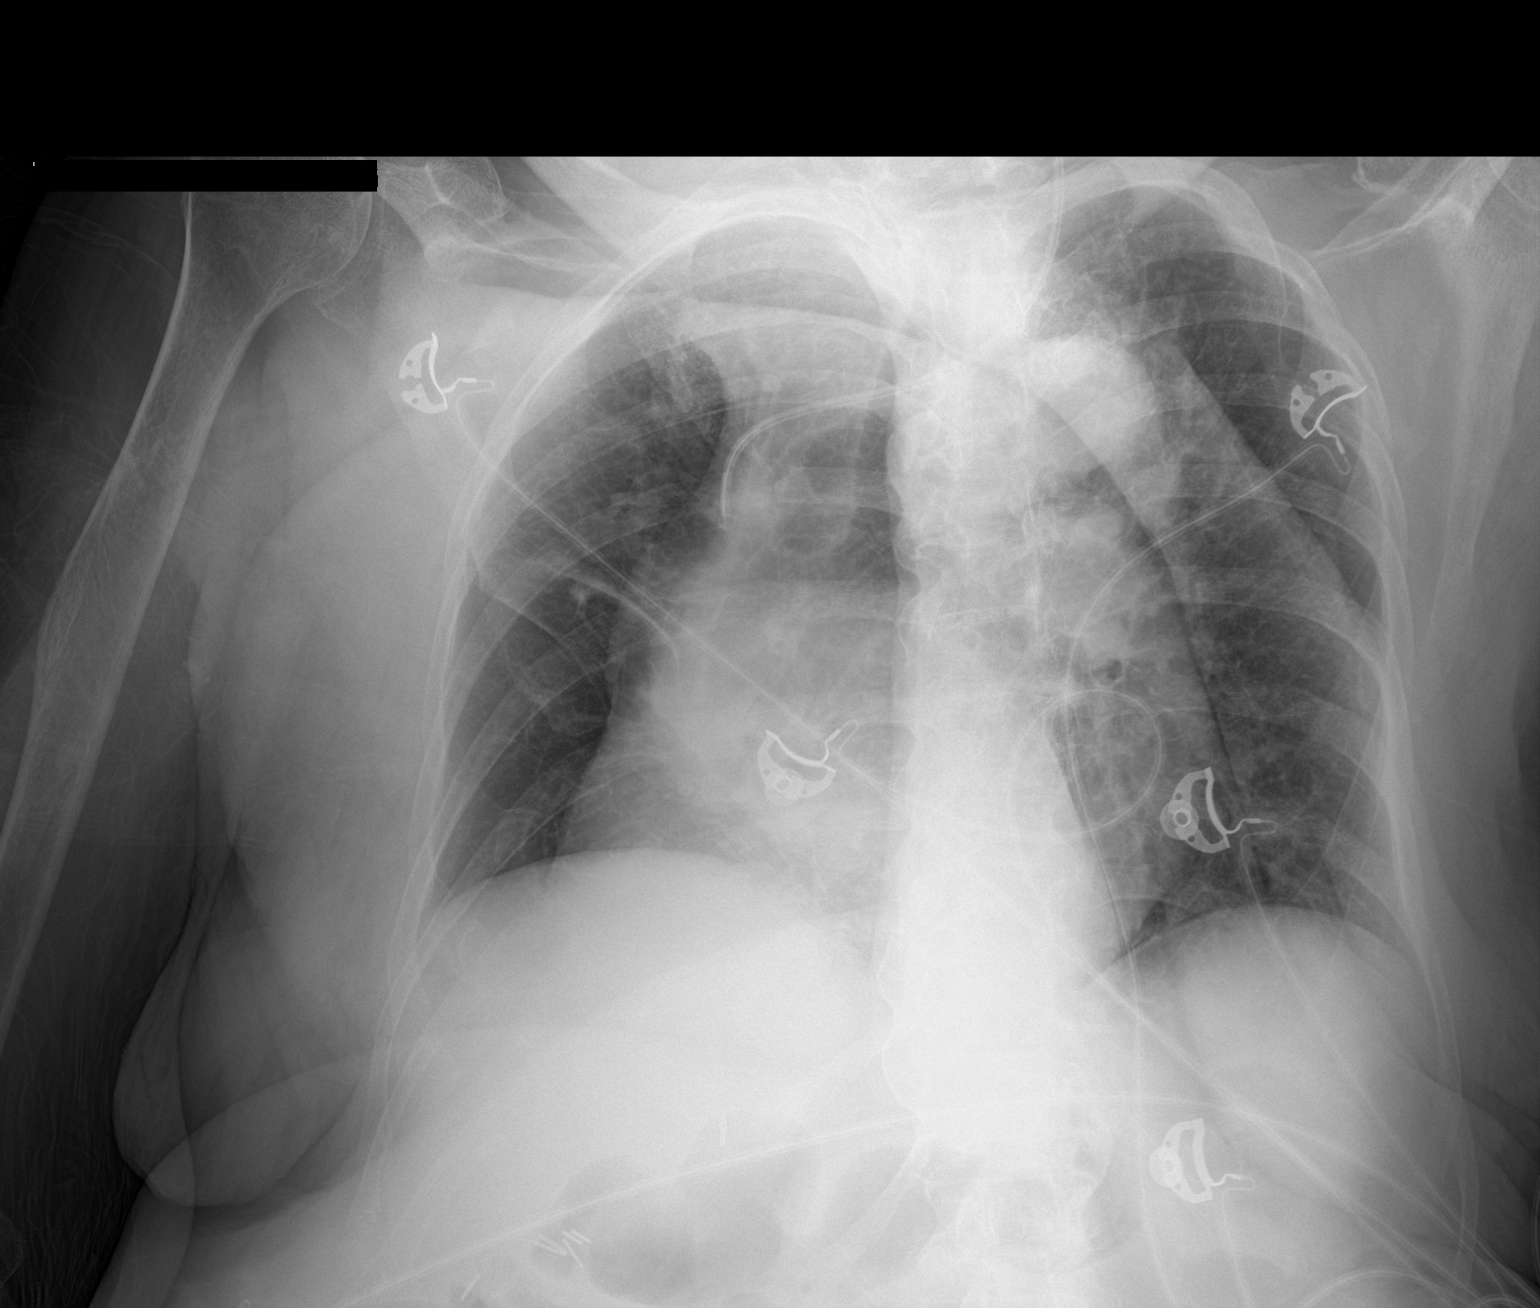

[1 of 1 positions shown; findings below may reference images not displayed]

FINDINGS: Central venous catheter via LEFT internal jugular venous approach
with distal tip projecting in proximal superior vena cava. Patient
rotated to the RIGHT. Mild cardiomegaly. No pleural effusion or
focal consolidation. No pneumothorax. RIGHT mid lung zone granuloma
versus projectional artifact. Skin fold projected chest. Surgical
clips in the included right abdomen compatible with cholecystectomy.
IMPRESSION: 1. LEFT internal jugular central venous catheter distal tip projects
in proximal superior vena cava. No pneumothorax.
2. Mild cardiomegaly, no acute pulmonary process.

## 2019-08-04 MED ORDER — POTASSIUM CHLORIDE 10 MEQ/100ML IV SOLN
10.0000 meq | INTRAVENOUS | Status: AC
  Administered 2019-08-04 (×4): 10 meq via INTRAVENOUS
  Filled 2019-08-04 (×4): qty 100

## 2019-08-04 NOTE — Progress Notes (Signed)
This patient had a colonoscopy yesterday with the findings of a cecal mass.  The lesion was friable and a large portion of it was sent for pathology but could not be removed entirely.  Clips were placed upon it to try and decrease any further bleeding.  The lesion in the cecum was not amenable to any further endoscopic intervention. I recommend the primary team discussing the options of surgery versus palliative care with the patient's family.  I will sign off.  Please call if any further GI concerns or questions.  We would like to thank you for the opportunity to participate in the care of [employer].

## 2019-08-04 NOTE — Anesthesia Postprocedure Evaluation (Signed)
Anesthesia Post Note  Patient: Frances Gardner  Procedure(s) Performed: COLONOSCOPY (N/A )  Patient location during evaluation: Endoscopy Anesthesia Type: General Level of consciousness: awake and alert Pain management: pain level controlled Vital Signs Assessment: post-procedure vital signs reviewed and stable Respiratory status: spontaneous breathing, nonlabored ventilation, respiratory function stable and patient connected to nasal cannula oxygen Cardiovascular status: blood pressure returned to baseline and stable Postop Assessment: no apparent nausea or vomiting Anesthetic complications: no     Last Vitals:  Vitals:   08/04/19 0018 08/04/19 0803  BP: 109/67 (!) 115/58  Pulse: 76 (!) 59  Resp: 20   Temp: 36.6 C 37.2 C  SpO2: 99% 100%    Last Pain:  Vitals:   08/04/19 0907  TempSrc:   PainSc: Asleep                 Martha Clan

## 2019-08-04 NOTE — Progress Notes (Signed)
D: Pt alert and oriented x 2/3, however is easily redirected. Pt denies experiencing any pain at this time. Pt observed as experiencing periods of drowsiness. Pt tolerated mouth care well.  A: Scheduled medications administered to pt, per MD orders. Support and encouragement provided. Frequent verbal contact made.   R: No adverse drug reactions noted. Pt complaint with medications and treatment plan. Pt interacts well with staff on the unit. Pt is stable at this time, will continue to monitor and provide care for as ordered

## 2019-08-04 NOTE — Progress Notes (Addendum)
PROGRESS NOTE    Frances Gardner  [id number] DOB: Aug 03, 1926 DOA: 07/27/2019 PCP: Gareth Morgan, MD    Brief Narrative:  [name] a 86 y.o.femalewith medical history significant ofParkinson's disease with related dementia, chronic anemia with recorded HGB of 7.9. Admitted Jan 2020 for GI bleed-negative EGD, did not have colonoscopy, + bleeding scan but bleeding stopped before any procedure. Diagnosis - presumed diverticular bleed. Patient was report by SNF staff to have had blood per rectum. She was referred to [facility] for further evaluatio    Consultants:   GI  Procedures: Flex sig  Antimicrobials:   None  Subjective: Patient is awake alert and oriented.  Responding appropriately.  I discussed colonoscopy findings and plans for surgery versus palliative care.  This a.m. she has no complaints.  No further dark stool has been reported this AM.  She denies abdominal pain, nausea or vomiting.  Objective: Vitals:   08/03/19 1134 08/03/19 1154 08/04/19 0018 08/04/19 0803  BP:  (!) 125/102 109/67 (!) 115/58  Pulse:   76 (!) 59  Resp:   20   Temp: 99 F (37.2 C)  97.9 F (36.6 C) 99 F (37.2 C)  TempSrc: Temporal  Oral Oral  SpO2:   99% 100%  Weight:      Height:        Intake/Output Summary (Last 24 hours) at 08/04/2019 0806 Last data filed at 08/04/2019 0556 Gross per 24 hour  Intake 1067.7 ml  Output --  Net 1067.7 ml   Filed Weights   07/27/19 1448 07/27/19 1622 07/27/19 2037  Weight: (!) 169 kg 90.7 kg 77.8 kg    Examination:  General exam: Appears calm and comfortable , NAD, interactive, appropriate Respiratory system: Clear to auscultation anteriorly  No crackles,  wheezing or rhonchi, poor respiratory effort Cardiovascular system: S1 & S2 heard, RRR. No , murmurs, rubs, gallops or clicks.  Gastrointestinal system: Abdomen is nondistended, soft and nontender.  Normal bowel sounds heard.  No rebound or guarding Central nervous system: Awake and  alert x3 Extremities: generalized mild edema bilateral lower extremities. No cyanosis Skin: warm, dry Psychiatry:  Mood & affect appropriate in current setting    Data Reviewed: I have personally reviewed following labs and imaging studies  CBC: Recent Labs  Lab 07/30/19 0925 07/30/19 0925 07/31/19 0651 08/01/19 1719 08/02/19 0741 08/03/19 0136 08/04/19 0430  WBC 7.5  --  7.0  --  6.1 6.5  --   HGB 7.5*   < > 8.4* 6.7* 10.1* 9.8* 10.3*  HCT 24.1*   < > 27.1* 22.1* 32.5* 31.6* 33.3*  MCV 88.0  --  87.1  --  90.0 89.5  --   PLT 135*  --  94*  --  150 160  --    < > = values in this interval not displayed.   Basic Metabolic Panel: Recent Labs  Lab 08/01/19 0633 08/02/19 0741 08/03/19 0136 08/04/19 0430  NA 143  --   --   --   K 3.2* 3.5 2.9* 3.0*  CL 111  --   --   --   CO2 27  --   --   --   GLUCOSE 88  --   --   --   BUN 8  --   --   --   CREATININE 0.49  --   --   --   CALCIUM 8.5*  --   --   --   MG  --   --  1.9  --    GFR: Estimated Creatinine Clearance: 51.3 mL/min (by C-G formula based on SCr of 0.49 mg/dL). Liver Function Tests: No results for input(s): AST, ALT, ALKPHOS, BILITOT, PROT, ALBUMIN in the last 168 hours. No results for input(s): LIPASE, AMYLASE in the last 168 hours. No results for input(s): AMMONIA in the last 168 hours. Coagulation Profile: No results for input(s): INR, PROTIME in the last 168 hours. Cardiac Enzymes: No results for input(s): CKTOTAL, CKMB, CKMBINDEX, TROPONINI in the last 168 hours. BNP (last 3 results) No results for input(s): PROBNP in the last 8760 hours. HbA1C: No results for input(s): HGBA1C in the last 72 hours. CBG: Recent Labs  Lab 07/30/19 0313  GLUCAP 85   Lipid Profile: No results for input(s): CHOL, HDL, LDLCALC, TRIG, CHOLHDL, LDLDIRECT in the last 72 hours. Thyroid Function Tests: No results for input(s): TSH, T4TOTAL, FREET4, T3FREE, THYROIDAB in the last 72 hours. Anemia Panel: No results for  input(s): VITAMINB12, FOLATE, FERRITIN, TIBC, IRON, RETICCTPCT in the last 72 hours. Sepsis Labs: No results for input(s): PROCALCITON, LATICACIDVEN in the last 168 hours.  Recent Results (from the past 240 hour(s))  Respiratory Panel by RT PCR (Flu A&B, Covid) - Nasopharyngeal Swab     Status: None   Collection Time: 07/27/19  4:22 PM   Specimen: Nasopharyngeal Swab  Result Value Ref Range Status   SARS Coronavirus 2 by RT PCR NEGATIVE NEGATIVE Final    Comment: (NOTE) SARS-CoV-2 target nucleic acids are NOT DETECTED. The SARS-CoV-2 RNA is generally detectable in upper respiratoy specimens during the acute phase of infection. The lowest concentration of SARS-CoV-2 viral copies this assay can detect is 131 copies/mL. A negative result does not preclude SARS-Cov-2 infection and should not be used as the sole basis for treatment or other patient management decisions. A negative result may occur with  improper specimen collection/handling, submission of specimen other than nasopharyngeal swab, presence of viral mutation(s) within the areas targeted by this assay, and inadequate number of viral copies (<131 copies/mL). A negative result must be combined with clinical observations, patient history, and epidemiological information. The expected result is Negative. Fact Sheet for Patients:  PinkCheek.be Fact Sheet for Healthcare Providers:  GravelBags.it This test is not yet ap proved or cleared by the [place] FDA and  has been authorized for detection and/or diagnosis of SARS-CoV-2 by FDA under an Emergency Use Authorization (EUA). This EUA will remain  in effect (meaning this test can be used) for the duration of the COVID-19 declaration under Section 564(b)(1) of the Act, 21 U.S.C. section 360bbb-3(b)(1), unless the authorization is terminated or revoked sooner.    Influenza A by PCR NEGATIVE NEGATIVE Final   Influenza B  by PCR NEGATIVE NEGATIVE Final    Comment: (NOTE) The Xpert Xpress SARS-CoV-2/FLU/RSV assay is intended as an aid in  the diagnosis of influenza from Nasopharyngeal swab specimens and  should not be used as a sole basis for treatment. Nasal washings and  aspirates are unacceptable for Xpert Xpress SARS-CoV-2/FLU/RSV  testing. Fact Sheet for Patients: PinkCheek.be Fact Sheet for Healthcare Providers: GravelBags.it This test is not yet approved or cleared by the [place] FDA and  has been authorized for detection and/or diagnosis of SARS-CoV-2 by  FDA under an Emergency Use Authorization (EUA). This EUA will remain  in effect (meaning this test can be used) for the duration of the  Covid-19 declaration under Section 564(b)(1) of the Act, 21  U.S.C. section 360bbb-3(b)(1), unless the authorization  is  terminated or revoked. Performed at [facility], [address] [place], [place] [address]   MRSA PCR Screening     Status: None   Collection Time: 07/27/19  9:18 PM   Specimen: Nasopharyngeal  Result Value Ref Range Status   MRSA by PCR NEGATIVE NEGATIVE Final    Comment:        The GeneXpert MRSA Assay (FDA approved for NASAL specimens only), is one component of a comprehensive MRSA colonization surveillance program. It is not intended to diagnose MRSA infection nor to guide or monitor treatment for MRSA infections. Performed at [facility], [address], [place], [place] [address]          Radiology Studies: No results found.      Scheduled Meds: . carbidopa-levodopa  2 tablet Oral TID  . chlorhexidine  15 mL Mouth Rinse BID  . latanoprost  1 drop Both Eyes QHS  . mouth rinse  15 mL Mouth Rinse q12n4p   Continuous Infusions: . sodium chloride 50 mL/hr at 08/04/19 0241  . sodium chloride Stopped (08/02/19 0547)    Assessment & Plan:   Active Problems:   Lower GI bleed    Dementia due to Parkinson's disease without behavioral disturbance (HCC)   Parkinson disease ([place])   Hematochezia   Goals of care, counseling/discussion   Palliative care encounter   Hypokalemia   1. GI bleed/hematochezia - no active bleedingsince coming to the ED -in ED but did have maroon stools in the vault by ED examiner -Had previous bleed, likely diverticular in nature with negative EGD. s/p transfuse 1 unit packed red blood cells 07/28/18 GI, Dr.Vanga following, status post flex sig but prep not good.  Plan for colonoscopy. Need to place NGT for prep for possible cscope- however per GI, given advanced dementia and will not be able to tolerate bowel prep orally and her age, quality of life and overall health condition,they will hold off on interventions/cscoping, and rec. palliative care and probably hospice . H/h currently stable, continue to monitor 2/5- spoke to health proxy grandson, who reports pt is more interactive and wants to proceed with colonoscopy.  He also stated he spoke to GI doctor Dr. Marius Ditch and would like to proceed with this. 2/6 s/p 2 U PRBC transfused with improvement of hemodynamics Plan: S/p cscope now..>cecum mass, see full report. Spoke to grandson who wants surgery involved , consulted surgery . I spoke to patient this a.m. about colonoscopy finding-surgery versus palliative care.  She would like to discuss this further with the surgeon and then make the final decision.  Obviously she is at a higher risk due to her age and comorbidities.  General surgery input pending Monitor h/h, currently stable   2. Dementia - stable  3. Parkinson's - continue home medsSinemet  4. HTN - stable   5. Glaucoma -continue Xalatan  6.  Hypokalemia-replace,ck am labs   DVT prophylaxis:SCDs Code Status:DNR Family Communication:uptdated health proxy [place] at [id number].  Disposition Plan:Return to SNF, once surgery evaluates pt and make a decision on surgery  v.s. nothing/hospice as pt's grandson wants surgical evaluation.Pt is mentation is good and with it, she will need to make the final decision about whether to pursue surgery versus palliative care.  I discussed this with general surgery attending today.  Awaiting final decision.  If no further treatment is needed she can be returned to SNF in a.m.  I also spoke to pace doctor who is her pcp , Dr.  Dr Dionicia Abler number [phone], updated her. She agrees she is not a great candidate for surgery and will discuss this with the patients grandson.      LOS: 7 days   Time spent: 45 min with >50% coc    Nolberto Hanlon, MD [facility] Pager [phone]  If 7PM-7AM, please contact night-coverage [email or web address] Password [facility] 08/04/2019, 8:06 AM Patient ID: Thomasa Solis, female   DOB: 1926/08/04, 86 y.o.   MRN: [id number]

## 2019-08-04 NOTE — TOC Progression Note (Signed)
Transition of Care [facility]) - Progression Note    Patient Details  Name: Frances Gardner MRN: [id number] Date of Birth: 1926-11-17  Transition of Care [facility]) CM/SW [place], RN Phone Number: 08/04/2019, 10:07 AM  Clinical Narrative:    I called Dr Dionicia Abler at her  request , [phone] I explained that the plan was for the patient to return to [facility] where she is a long term resident and followed by the [facility], she requested a call from the physician to speak about the medical plan for the patient.  I notified Dr Kurtis Bushman of the request for a call and provided the phone number.        Expected Discharge Plan and Services                                                 Social Determinants of Health (SDOH) Interventions    Readmission Risk Interventions No flowsheet data found.

## 2019-08-04 NOTE — NC FL2 (Signed)
[place] LEVEL OF CARE SCREENING TOOL     IDENTIFICATION  Patient Name: Frances Gardner Birthdate: [birth year] Sex: female Admission Date (Current Location): 07/27/2019  [place] and [place] Number:  Engineering geologist and Address:  [facility], [address] [place], [place] [address]      Provider Number: [id number]  Attending Physician Name and Address:  Nolberto Hanlon, MD  Relative Name and Phone Number:  [place] Grandson [phone]    Current Level of Care: Hospital Recommended Level of Care: [place], Other (Comment)(Long term care) Prior Approval Number:    Date Approved/Denied:   PASRR Number: [id number]  Discharge Plan: Other (Comment)(Long Term Care SNF)    Current Diagnoses: Patient Active Problem List   Diagnosis Date Noted  . Hypokalemia   . Goals of care, counseling/discussion   . Palliative care encounter   . Dementia due to Parkinson's disease without behavioral disturbance ([place]) 07/27/2019  . Parkinson disease ([place]) 07/27/2019  . Hematochezia 07/27/2019  . Acute blood loss anemia   . Hypotension   . Lower GI bleed 06/29/2018    Orientation RESPIRATION BLADDER Height & Weight     (confused X 4)  O2(4 liters) Incontinent Weight: 77.8 kg Height:  5\' 8"  (172.7 cm)  BEHAVIORAL SYMPTOMS/MOOD NEUROLOGICAL BOWEL NUTRITION STATUS      Incontinent Diet(Full Liquid)  AMBULATORY STATUS COMMUNICATION OF NEEDS Skin   Extensive Assist Verbally                         Personal Care Assistance Level of Assistance  Total care       Total Care Assistance: Maximum assistance   Functional Limitations Info  Speech, Hearing, Sight Sight Info: Impaired Hearing Info: Impaired Speech Info: Impaired    SPECIAL CARE FACTORS FREQUENCY                       Contractures Contractures Info: Not present    Additional Factors Info  Code Status, Allergies Code Status Info: DNR Allergies Info:  Pennicillin, Shellfish           Current Medications (08/04/2019):  This is the current hospital active medication list Current Facility-Administered Medications  Medication Dose Route Frequency Provider Last Rate Last Admin  . 0.45 % sodium chloride infusion   Intravenous Continuous Nolberto Hanlon, MD 50 mL/hr at 08/04/19 0241 New Bag at 08/04/19 0241  . 0.9 %  sodium chloride infusion   Intravenous PRN Nolberto Hanlon, MD   Stopped at 08/02/19 0547  . acetaminophen (TYLENOL) tablet 650 mg  650 mg Oral Q6H PRN Neena Rhymes, MD   650 mg at 08/02/19 2001  . carbidopa-levodopa (SINEMET IR) 25-100 MG per tablet immediate release 2 tablet  2 tablet Oral TID Neena Rhymes, MD   2 tablet at 08/04/19 0910  . chlorhexidine (PERIDEX) 0.12 % solution 15 mL  15 mL Mouth Rinse BID Nolberto Hanlon, MD   15 mL at 08/04/19 0910  . latanoprost (XALATAN) 0.005 % ophthalmic solution 1 drop  1 drop Both Eyes QHS Norins, Heinz Knuckles, MD   1 drop at 08/03/19 2144  . MEDLINE mouth rinse  15 mL Mouth Rinse q12n4p Nolberto Hanlon, MD   15 mL at 08/02/19 1738  . morphine CONCENTRATE 10 MG/0.5ML oral solution 4 mg  4 mg Oral Q2H PRN Norins, Heinz Knuckles, MD      . polyvinyl alcohol (LIQUIFILM TEARS) 1.4 % ophthalmic  solution 1 drop  1 drop Both Eyes BID PRN Norins, Heinz Knuckles, MD         Discharge Medications: Please see discharge summary for a list of discharge medications.  Relevant Imaging Results:  Relevant Lab Results:   Additional Information SS# [id number]  Su Hilt, RN

## 2019-08-04 NOTE — Care Management Important Message (Signed)
Important Message  Patient Details  Name: Frances Gardner MRN: [id number] Date of Birth: 1927/01/18   Medicare Important Message Given:  No  Patient sleeping soundly and not able to obtain signature on Initial Important Message.  Left a copy in the patient's room and will follow-up again.  [name] 08/04/2019, 10:41 AM

## 2019-08-04 NOTE — Consult Note (Signed)
Subjective:   CC: GI bleed, ileocolic mass  HPI:  Frances Gardner is a 86 y.o. female who was consulted by [facility] for issue above.  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  Surgery consulted for possible resection.    No hx able to be obtained from patient.  Despite multiple attempts, patient unable to answer majority of questions.   Past Medical History:  has a past medical history of Dementia ([place]), Hypothyroidism, and Parkinson's disease ([place]).  Past Surgical History:  Past Surgical History:  Procedure Laterality Date  . COLONOSCOPY N/A 08/03/2019   Procedure: COLONOSCOPY;  Surgeon: Jonathon Bellows, MD;  Location: [facility] ENDOSCOPY;  Service: Gastroenterology;  Laterality: N/A;  . COLONOSCOPY WITH PROPOFOL N/A 07/03/2018   Procedure: COLONOSCOPY WITH PROPOFOL;  Surgeon: Lin Landsman, MD;  Location: [facility] ENDOSCOPY;  Service: Gastroenterology;  Laterality: N/A;  . ESOPHAGOGASTRODUODENOSCOPY (EGD) WITH PROPOFOL N/A 07/03/2018   Procedure: ESOPHAGOGASTRODUODENOSCOPY (EGD) WITH PROPOFOL;  Surgeon: Lin Landsman, MD;  Location: [facility] ENDOSCOPY;  Service: Gastroenterology;  Laterality: N/A;  . FLEXIBLE SIGMOIDOSCOPY N/A 07/29/2019   Procedure: FLEXIBLE SIGMOIDOSCOPY;  Surgeon: Lin Landsman, MD;  Location: [facility] ENDOSCOPY;  Service: Gastroenterology;  Laterality: N/A;  . IR ANGIOGRAM VISCERAL SELECTIVE  06/30/2018    Family History: family history is not on file.  Social History:  reports that she has never smoked. She has never used smokeless tobacco. No history on file for alcohol and drug.  Current Medications:  Medications Prior to Admission  Medication Sig Dispense Refill  . acetaminophen (TYLENOL) 650 MG CR tablet Take 650 mg by mouth every 8 (eight) hours as needed for pain.    . carbidopa-levodopa (SINEMET IR) 25-100 MG tablet Take 2 tablets by mouth 3 (three) times daily.    . cholecalciferol (VITAMIN D) 25 MCG (1000 UT) tablet Take 1,000  Units by mouth daily.    Marland Kitchen latanoprost (XALATAN) 0.005 % ophthalmic solution Place 1 drop into both eyes at bedtime.     Marland Kitchen loperamide (IMODIUM) 2 MG capsule Take 2-4 mg by mouth See admin instructions. Take 2 capsules (4mg ) by mouth after first loose stool and then take 1 capsule (2mg ) by mouth after each subsequent loose stool - max 8 capsules in 24 hours    . mineral oil-hydrophilic petrolatum (AQUAPHOR) ointment Apply 1 application topically Nightly.    . Morphine Sulfate (MORPHINE CONCENTRATE) 10 mg / 0.5 ml concentrated solution Take 4 mg by mouth every 2 (two) hours as needed for severe pain or shortness of breath.    Vladimir Faster Glycol-Propyl Glycol (SYSTANE) 0.4-0.3 % SOLN Place 1 drop into both eyes 2 (two) times daily as needed (dry eyes).    Marland Kitchen senna (SENOKOT) 8.6 MG TABS tablet Take 8.6 mg by mouth 2 (two) times daily as needed for mild constipation.    . Zinc Oxide (DESITIN) 13 % CREA Apply 1 application topically 3 (three) times daily. (after toileting)    . acetaminophen (TYLENOL) 325 MG tablet Take 650 mg by mouth every 6 (six) hours as needed.    Marland Kitchen guaiFENesin-dextromethorphan (ROBITUSSIN DM) 100-10 MG/5ML syrup Take 5 mLs by mouth every 4 (four) hours as needed for cough.    . naloxone (NARCAN) nasal spray 4 mg/0.1 mL Place 1 spray into the nose.      Allergies:  Allergies as of 07/27/2019 - Review Complete 07/27/2019  Allergen Reaction Noted  . Penicillins  06/29/2018  . Shellfish-derived products  06/29/2018  ROS:  Unable to obtain secondary to patient mentation    Objective:     BP (!) 159/94 (BP Location: Right Arm)   Pulse 79   Temp 99 F (37.2 C) (Oral)   Resp 17   Ht 5\' 8"  (1.727 m)   Wt 77.8 kg   SpO2 98%   BMI 26.08 kg/m   Constitutional :  cooperative, no distress and slowed mentation  Lymphatics/Throat:  no asymmetry, masses, or scars  Respiratory:  clear to auscultation bilaterally  Cardiovascular:  regular rate and rhythm  Gastrointestinal:  soft, non-tender; bowel sounds normal; no masses,  no organomegaly.   Musculoskeletal: Steady movement  Skin: Cool and moist   Psychiatric: Normal affect, non-agitated, not confused       LABS:  CMP Latest Ref Rng & Units 08/04/2019 08/04/2019 08/03/2019  Glucose 70 - 99 mg/dL - - -  BUN 8 - 23 mg/dL - - -  Creatinine 0.44 - 1.00 mg/dL - - -  Sodium 135 - 145 mmol/L - - -  Potassium 3.5 - 5.1 mmol/L 3.9 3.0(L) 2.9(L)  Chloride 98 - 111 mmol/L - - -  CO2 22 - 32 mmol/L - - -  Calcium 8.9 - 10.3 mg/dL - - -  Total Protein 6.5 - 8.1 g/dL - - -  Total Bilirubin 0.3 - 1.2 mg/dL - - -  Alkaline Phos 38 - 126 U/L - - -  AST 15 - 41 U/L - - -  ALT 0 - 44 U/L - - -   CBC Latest Ref Rng & Units 08/04/2019 08/03/2019 08/02/2019  WBC 4.0 - 10.5 K/uL - 6.5 6.1  Hemoglobin 12.0 - 15.0 g/dL 10.3(L) 9.8(L) 10.1(L)  Hematocrit 36.0 - 46.0 % 33.3(L) 31.6(L) 32.5(L)  Platelets 150 - 400 K/uL - 160 150    RADS: n/a Assessment:   IC valve mass, 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  Pt comfortable at this time.  Plan:   Pt more awake but not fully comprehending the conversation. When I asked if she signs her own consent, she said her grandson takes care of that. I talked with vascular surgery and they said if mass starts bleeding again, they can attempt an embolization. Recommend continued monitoring for now rather than proceeding with surgical resection which carries significant risk for little improvement in quality of care.  Final path is still pending as well, another reason why proceeding with surgical resection will be premature.  Will update grandson

## 2019-08-05 LAB — HEMOGLOBIN AND HEMATOCRIT, BLOOD
HCT: 31.5 % — ABNORMAL LOW (ref 36.0–46.0)
Hemoglobin: 9.7 g/dL — ABNORMAL LOW (ref 12.0–15.0)

## 2019-08-05 LAB — SURGICAL PATHOLOGY

## 2019-08-05 NOTE — Care Management Important Message (Signed)
Important Message  Patient Details  Name: Frances Gardner MRN: [id number] Date of Birth: Feb 24, 1927   Medicare Important Message Given:  No  Unable to obtain signature on initial Important Message.  Patient was sleeping and I tried waking her.  There was no family in the room to review document so left another copy on her bedside table.    [name] 08/05/2019, 1:34 PM

## 2019-08-05 NOTE — Progress Notes (Signed)
Report called to [place], [place], at [facility].

## 2019-08-05 NOTE — Plan of Care (Signed)
  Problem: Education: Goal: Knowledge of General Education information will improve Description: Including pain rating scale, medication(s)/side effects and non-pharmacologic comfort measures Outcome: Not Progressing Note: AMS, confused patient   Problem: Clinical Measurements: Goal: Ability to maintain clinical measurements within normal limits will improve Outcome: Progressing Goal: Will remain free from infection Outcome: Progressing Goal: Diagnostic test results will improve Outcome: Progressing Goal: Cardiovascular complication will be avoided Outcome: Progressing   Problem: Activity: Goal: Risk for activity intolerance will decrease Outcome: Not Progressing Note: Bed bound patient   Problem: Coping: Goal: Level of anxiety will decrease Outcome: Progressing   Problem: Pain Managment: Goal: General experience of comfort will improve Outcome: Progressing

## 2019-08-05 NOTE — Discharge Summary (Signed)
Physician Discharge Summary  Frances Gardner [id number] DOB: 1926-07-21 DOA: 07/27/2019  PCP: Gareth Morgan, MD  Admit date: 07/27/2019 Discharge date: 08/05/2019  Admitted From: [facility] Disposition:  [facility]  Recommendations for Outpatient Follow-up:  1. Follow up with PCP in 1-2 weeks 2. Please obtain BMP/CBC in one week 3. Please follow up with oncology to discuss any potential treatment options for colon mass.   4. Follow up pathology results from colon mass biopsy.  Home Health: No  Equipment/Devices: None   Discharge Condition: Stable  CODE STATUS: DNR  Diet recommendation: resume previous diet  Brief/Interim Summary:  Frances Stanleyis a 86 y.o.femalewith medical history significant ofParkinson's disease with related dementia, chronic anemia with recorded HGB of 7.9 who was sent to the ED from [facility] on 07/27/19 due to bright red blood per rectum.  Of note, patient was admitted in Jan 2020 for GI bleed-negative EGD, did not have colonoscopy, + bleeding scan but bleeding stopped before any procedure. Bleeding at that time was presumed to be diverticular source.  In the ED, had maroon stool.  Patient was transfused 1 unit pRBC's on admission.  GI was consulted and patient underwent colonoscopy which showed a mass at the cecum.  Patient's grandson requested surgery consult.  Surgery at this time would be premature given pathology not back yet, and patient very poor surgical candidate given her advanced age and comorbidities. Surgery deferred at this time.  Patient required additional 2 units red blood cell transfusion for acute on chronic anemia.  Hemoglobin monitored closely and has been stable.  Patient is stable for discharge back to [facility] under the care of [facility].  She is to follow up with oncology once pathology results have returned to discuss any potential treatment options.     Discharge Diagnoses: Active Problems:   Lower GI bleed    Dementia due to Parkinson's disease without behavioral disturbance (HCC)   Parkinson disease (HCC)   Hematochezia   Goals of care, counseling/discussion   Palliative care encounter   Hypokalemia    Discharge Instructions   Discharge Instructions    Call MD for:  extreme fatigue   Complete by: As directed    Call MD for:  severe uncontrolled pain   Complete by: As directed    Call MD for:  temperature >100.4   Complete by: As directed    Diet - low sodium heart healthy   Complete by: As directed    Increase activity slowly   Complete by: As directed      Allergies as of 08/05/2019      Reactions   Penicillins    Shellfish-derived Products       Medication List    TAKE these medications   acetaminophen 650 MG CR tablet Commonly known as: TYLENOL Take 650 mg by mouth every 8 (eight) hours as needed for pain.   acetaminophen 325 MG tablet Commonly known as: TYLENOL Take 650 mg by mouth every 6 (six) hours as needed.   carbidopa-levodopa 25-100 MG tablet Commonly known as: SINEMET IR Take 2 tablets by mouth 3 (three) times daily.   cholecalciferol 25 MCG (1000 UNIT) tablet Commonly known as: VITAMIN D Take 1,000 Units by mouth daily.   Desitin 13 % Crea Generic drug: Zinc Oxide Apply 1 application topically 3 (three) times daily. (after toileting)   guaiFENesin-dextromethorphan 100-10 MG/5ML syrup Commonly known as: ROBITUSSIN DM Take 5 mLs by mouth every 4 (four) hours as  needed for cough.   loperamide 2 MG capsule Commonly known as: IMODIUM Take 2-4 mg by mouth See admin instructions. Take 2 capsules (22m) by mouth after first loose stool and then take 1 capsule (234m by mouth after each subsequent loose stool - max 8 capsules in 24 hours   mineral oil-hydrophilic petrolatum ointment Apply 1 application topically Nightly.   morphine CONCENTRATE 10 mg / 0.5 ml concentrated solution Take 4 mg by mouth every 2 (two) hours as needed for severe pain or  shortness of breath.   Narcan 4 MG/0.1ML Liqd nasal spray kit Generic drug: naloxone Place 1 spray into the nose.   senna 8.6 MG Tabs tablet Commonly known as: SENOKOT Take 8.6 mg by mouth 2 (two) times daily as needed for mild constipation.   Systane 0.4-0.3 % Soln Generic drug: Polyethyl Glycol-Propyl Glycol Place 1 drop into both eyes 2 (two) times daily as needed (dry eyes).   Xalatan 0.005 % ophthalmic solution Generic drug: latanoprost Place 1 drop into both eyes at bedtime.       Allergies  Allergen Reactions  . Penicillins   . Shellfish-Derived Products     Consultations:  Gastroenterology  General Surgery    Procedures/Studies: DG Abd 1 View  Result Date: 07/27/2019 CLINICAL DATA:  Rectal bleeding. EXAM: ABDOMEN - 1 VIEW COMPARISON:  None. FINDINGS: The bowel gas pattern is normal. Moderate to marked bowel content is identified throughout colon. Degenerative joint changes of the spine are identified. Prior cholecystectomy clips are noted. IMPRESSION: No bowel obstruction. Moderate to marked bowel content identified throughout colon. This can be seen in constipation. Electronically Signed   By: WeAbelardo Diesel.D.   On: 07/27/2019 16:46     Colonoscopy - cecal mass seen, biopsies sent, pathology pending upon discharge  Subjective: patient reports feeling well.  No acute events reported.  She denies seeing blood in stool or in commode.  Denies fever/chills, N/V/D, CP, SOB.   Discharge Exam: Vitals:   08/05/19 0020 08/05/19 0747  BP: 138/78 (!) 146/70  Pulse: 62 65  Resp: 16   Temp: (!) 97.5 F (36.4 C) 98.3 F (36.8 C)  SpO2: 100% 99%   Vitals:   08/04/19 0803 08/04/19 1616 08/05/19 0020 08/05/19 0747  BP: (!) 115/58 (!) 159/94 138/78 (!) 146/70  Pulse: (!) 59 79 62 65  Resp:  17 16   Temp: 99 F (37.2 C) 99 F (37.2 C) (!) 97.5 F (36.4 C) 98.3 F (36.8 C)  TempSrc: Oral Oral Oral Oral  SpO2: 100% 98% 100% 99%  Weight:      Height:         General: Pt is alert, awake, not in acute distress Cardiovascular: RRR, S1/S2 +, no rubs, no gallops Respiratory: CTA bilaterally, no wheezing, no rhonchi Abdominal: Soft, NT, ND, bowel sounds + Extremities: nonpitting edema b/l LE's, no cyanosis    The results of significant diagnostics from this hospitalization (including imaging, microbiology, ancillary and laboratory) are listed below for reference.     Microbiology: Recent Results (from the past 240 hour(s))  Respiratory Panel by RT PCR (Flu A&B, Covid) - Nasopharyngeal Swab     Status: None   Collection Time: 07/27/19  4:22 PM   Specimen: Nasopharyngeal Swab  Result Value Ref Range Status   SARS Coronavirus 2 by RT PCR NEGATIVE NEGATIVE Final    Comment: (NOTE) SARS-CoV-2 target nucleic acids are NOT DETECTED. The SARS-CoV-2 RNA is generally detectable in upper respiratoy specimens during the acute  phase of infection. The lowest concentration of SARS-CoV-2 viral copies this assay can detect is 131 copies/mL. A negative result does not preclude SARS-Cov-2 infection and should not be used as the sole basis for treatment or other patient management decisions. A negative result may occur with  improper specimen collection/handling, submission of specimen other than nasopharyngeal swab, presence of viral mutation(s) within the areas targeted by this assay, and inadequate number of viral copies (<131 copies/mL). A negative result must be combined with clinical observations, patient history, and epidemiological information. The expected result is Negative. Fact Sheet for Patients:  PinkCheek.be Fact Sheet for Healthcare Providers:  GravelBags.it This test is not yet ap proved or cleared by the [place] FDA and  has been authorized for detection and/or diagnosis of SARS-CoV-2 by FDA under an Emergency Use Authorization (EUA). This EUA will remain  in effect  (meaning this test can be used) for the duration of the COVID-19 declaration under Section 564(b)(1) of the Act, 21 U.S.C. section 360bbb-3(b)(1), unless the authorization is terminated or revoked sooner.    Influenza A by PCR NEGATIVE NEGATIVE Final   Influenza B by PCR NEGATIVE NEGATIVE Final    Comment: (NOTE) The Xpert Xpress SARS-CoV-2/FLU/RSV assay is intended as an aid in  the diagnosis of influenza from Nasopharyngeal swab specimens and  should not be used as a sole basis for treatment. Nasal washings and  aspirates are unacceptable for Xpert Xpress SARS-CoV-2/FLU/RSV  testing. Fact Sheet for Patients: PinkCheek.be Fact Sheet for Healthcare Providers: GravelBags.it This test is not yet approved or cleared by the [place] FDA and  has been authorized for detection and/or diagnosis of SARS-CoV-2 by  FDA under an Emergency Use Authorization (EUA). This EUA will remain  in effect (meaning this test can be used) for the duration of the  Covid-19 declaration under Section 564(b)(1) of the Act, 21  U.S.C. section 360bbb-3(b)(1), unless the authorization is  terminated or revoked. Performed at [facility], [address] [place], [place] [address]   MRSA PCR Screening     Status: None   Collection Time: 07/27/19  9:18 PM   Specimen: Nasopharyngeal  Result Value Ref Range Status   MRSA by PCR NEGATIVE NEGATIVE Final    Comment:        The GeneXpert MRSA Assay (FDA approved for NASAL specimens only), is one component of a comprehensive MRSA colonization surveillance program. It is not intended to diagnose MRSA infection nor to guide or monitor treatment for MRSA infections. Performed at [facility], [address] [place], [place] [address]      Labs: BNP (last 3 results) No results for input(s): BNP in the last 8760 hours. Basic Metabolic Panel: Recent Labs  Lab 08/01/19 0633  08/02/19 0741 08/03/19 0136 08/04/19 0430 08/04/19 1310  NA 143  --   --   --   --   K 3.2* 3.5 2.9* 3.0* 3.9  CL 111  --   --   --   --   CO2 27  --   --   --   --   GLUCOSE 88  --   --   --   --   BUN 8  --   --   --   --   CREATININE 0.49  --   --   --   --   CALCIUM 8.5*  --   --   --   --   MG  --   --  1.9  --   --    Liver Function Tests: No results for input(s): AST, ALT, ALKPHOS, BILITOT, PROT, ALBUMIN in the last 168 hours. No results for input(s): LIPASE, AMYLASE in the last 168 hours. No results for input(s): AMMONIA in the last 168 hours. CBC: Recent Labs  Lab 07/30/19 0925 07/30/19 0925 07/31/19 0651 07/31/19 0651 08/01/19 1719 08/02/19 0741 08/03/19 0136 08/04/19 0430 08/05/19 0305  WBC 7.5  --  7.0  --   --  6.1 6.5  --   --   HGB 7.5*   < > 8.4*   < > 6.7* 10.1* 9.8* 10.3* 9.7*  HCT 24.1*   < > 27.1*   < > 22.1* 32.5* 31.6* 33.3* 31.5*  MCV 88.0  --  87.1  --   --  90.0 89.5  --   --   PLT 135*  --  94*  --   --  150 160  --   --    < > = values in this interval not displayed.   Cardiac Enzymes: No results for input(s): CKTOTAL, CKMB, CKMBINDEX, TROPONINI in the last 168 hours. BNP: Invalid input(s): POCBNP CBG: Recent Labs  Lab 07/30/19 0313  GLUCAP 85   D-Dimer No results for input(s): DDIMER in the last 72 hours. Hgb A1c No results for input(s): HGBA1C in the last 72 hours. Lipid Profile No results for input(s): CHOL, HDL, LDLCALC, TRIG, CHOLHDL, LDLDIRECT in the last 72 hours. Thyroid function studies No results for input(s): TSH, T4TOTAL, T3FREE, THYROIDAB in the last 72 hours.  Invalid input(s): FREET3 Anemia work up No results for input(s): VITAMINB12, FOLATE, FERRITIN, TIBC, IRON, RETICCTPCT in the last 72 hours. Urinalysis    Component Value Date/Time   COLORURINE Yellow 04/03/2014 1100   APPEARANCEUR Hazy 04/03/2014 1100   LABSPEC 1.017 04/03/2014 1100   PHURINE 5.0 04/03/2014 1100   GLUCOSEU Negative 04/03/2014 1100    HGBUR 1+ 04/03/2014 1100   BILIRUBINUR Negative 04/03/2014 1100   KETONESUR Negative 04/03/2014 1100   PROTEINUR Negative 04/03/2014 1100   NITRITE Positive 04/03/2014 1100   LEUKOCYTESUR 1+ 04/03/2014 1100   Sepsis Labs Invalid input(s): PROCALCITONIN,  WBC,  LACTICIDVEN Microbiology Recent Results (from the past 240 hour(s))  Respiratory Panel by RT PCR (Flu A&B, Covid) - Nasopharyngeal Swab     Status: None   Collection Time: 07/27/19  4:22 PM   Specimen: Nasopharyngeal Swab  Result Value Ref Range Status   SARS Coronavirus 2 by RT PCR NEGATIVE NEGATIVE Final    Comment: (NOTE) SARS-CoV-2 target nucleic acids are NOT DETECTED. The SARS-CoV-2 RNA is generally detectable in upper respiratoy specimens during the acute phase of infection. The lowest concentration of SARS-CoV-2 viral copies this assay can detect is 131 copies/mL. A negative result does not preclude SARS-Cov-2 infection and should not be used as the sole basis for treatment or other patient management decisions. A negative result may occur with  improper specimen collection/handling, submission of specimen other than nasopharyngeal swab, presence of viral mutation(s) within the areas targeted by this assay, and inadequate number of viral copies (<131 copies/mL). A negative result must be combined with clinical observations, patient history, and epidemiological information. The expected result is Negative. Fact Sheet for Patients:  PinkCheek.be Fact Sheet for Healthcare Providers:  GravelBags.it This test is not yet ap proved or cleared by the [place] FDA and  has been authorized for detection and/or diagnosis of SARS-CoV-2 by FDA under an Emergency Use Authorization (EUA). This EUA  will remain  in effect (meaning this test can be used) for the duration of the COVID-19 declaration under Section 564(b)(1) of the Act, 21 U.S.C. section 360bbb-3(b)(1),  unless the authorization is terminated or revoked sooner.    Influenza A by PCR NEGATIVE NEGATIVE Final   Influenza B by PCR NEGATIVE NEGATIVE Final    Comment: (NOTE) The Xpert Xpress SARS-CoV-2/FLU/RSV assay is intended as an aid in  the diagnosis of influenza from Nasopharyngeal swab specimens and  should not be used as a sole basis for treatment. Nasal washings and  aspirates are unacceptable for Xpert Xpress SARS-CoV-2/FLU/RSV  testing. Fact Sheet for Patients: PinkCheek.be Fact Sheet for Healthcare Providers: GravelBags.it This test is not yet approved or cleared by the [place] FDA and  has been authorized for detection and/or diagnosis of SARS-CoV-2 by  FDA under an Emergency Use Authorization (EUA). This EUA will remain  in effect (meaning this test can be used) for the duration of the  Covid-19 declaration under Section 564(b)(1) of the Act, 21  U.S.C. section 360bbb-3(b)(1), unless the authorization is  terminated or revoked. Performed at [facility], [address] [place], [place] [address]   MRSA PCR Screening     Status: None   Collection Time: 07/27/19  9:18 PM   Specimen: Nasopharyngeal  Result Value Ref Range Status   MRSA by PCR NEGATIVE NEGATIVE Final    Comment:        The GeneXpert MRSA Assay (FDA approved for NASAL specimens only), is one component of a comprehensive MRSA colonization surveillance program. It is not intended to diagnose MRSA infection nor to guide or monitor treatment for MRSA infections. Performed at [facility], [address] [place], [place] [address]      Time coordinating discharge: Over 30 minutes  SIGNED:   Ezekiel Slocumb, DO [facility] 08/05/2019, 1:49 PM   If 7PM-7AM, please contact night-coverage [email or web address]

## 2019-08-05 NOTE — Progress Notes (Signed)
Attempted to call report to [facility]. [facility] had not been notified by [facility] that patient was returning to the facility. Will call [facility].

## 2019-08-05 NOTE — Plan of Care (Signed)

## 2019-08-05 NOTE — Consult Note (Signed)
WOC Nurse Consult Note: Reason for Consult: scattered areas of partial thickness skin loss in the buttock and perineal area, also noted is a skin elevation at the apex of the gluteal cleft/scocygeal region. Wound type: Moisture Pressure Injury POA: N/A Measurement: Skin elevation (skin tag) measures 0.4cm x 1cm and is fleshy, movable. Nursing tech is providing incontinence care following an episode of incontinence during my assessment and has just applied moisture barrier ointment Wound bed:As described above Drainage (amount, consistency, odor) None Periwound: Intact, dry Dressing procedure/placement/frequency: Patient is being positioned from side to side, timely incontinence care is being provided. Heels are provided with bilateral pressure redistribution boots. A silicone foam dressing is used to mitigate pressure in the sacral region.  [place] nursing team will not follow, but will remain available to this patient, the nursing and medical teams.  Please re-consult if needed. Thanks, Maudie Flakes, MSN, RN, [place], Arther Abbott  Pager# [phone]

## 2019-08-05 NOTE — Progress Notes (Signed)
EMS called for transport.

## 2019-08-05 NOTE — TOC Transition Note (Addendum)
Transition of Care [facility] - CM/SW Discharge Note   Patient Details  Name: Frances Gardner MRN: [id number] Date of Birth: 1926/06/28  Transition of Care [facility]) CM/SW Contact:  Su Hilt, RN Phone Number: 08/05/2019, 2:23 PM   Clinical Narrative:    Called Dr Lavone Neri at [facility] and let her know I have faxed the DC summary to them at [phone] She stated that they are coming to get the patient at 3 PM, I notified the bed side nurse that [facility] will be here to get the patient at 3 PM, [facility] came to the medical mall to pick up the patient with a Wheelchair, the patient is a total care bed bound and unable to get into a wheelchair,  The bedside nurse called report to [facility], I called the grandson [place] to notify him that the patient would be returning to [facility] however his VM has not been set up and I was unable to reach him. The bedside nurse determined she needs to transport via stretcher and called EMS, I provided the EMS paperwork.  The [facility] had not called [facility] to notify them that the patient is returning I called the [facility] physician Dr Lavone Neri and explained that the patient is non ambulatory and is unable to get into a wheelchair therefore needs to transport via stretcher to [facility], she agreed to the ESM tranpsort Final next level of care: [place] Barriers to Discharge: Barriers Resolved   Patient Goals and CMS Choice        Discharge Placement              Patient chooses bed at: [facility] Patient to be transferred to facility by: [facility] Name of family member notified: [facility] to notify the family    Discharge Plan and Services                                     Social Determinants of Health (SDOH) Interventions     Readmission Risk Interventions No flowsheet data found.

## 2019-08-06 IMAGING — US US ABDOMEN COMPLETE
1 series · 14 of 25 positions shown · non-contrast
Comparison: No recent prior.

CLINICAL DATA: Anemia.  Low platelet count.

EXAM:
ABDOMEN ULTRASOUND COMPLETE

[Series 1: us abdomen complete · 14 of 74 slices shown]
[im 1/74]
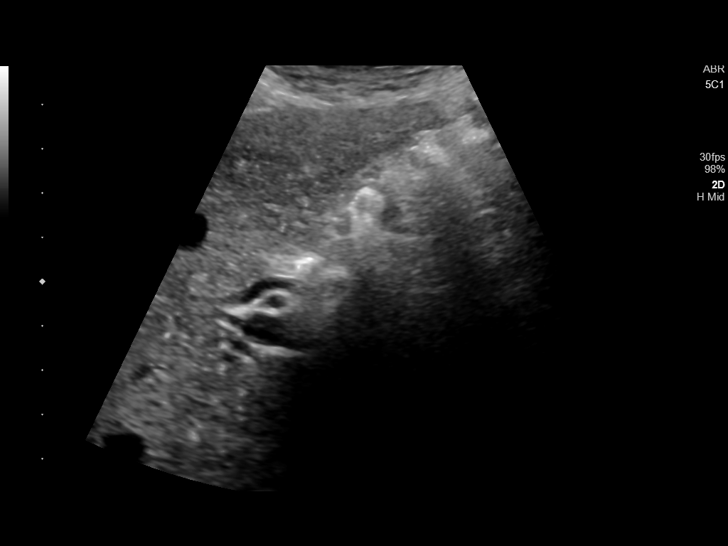
[im 7/74]
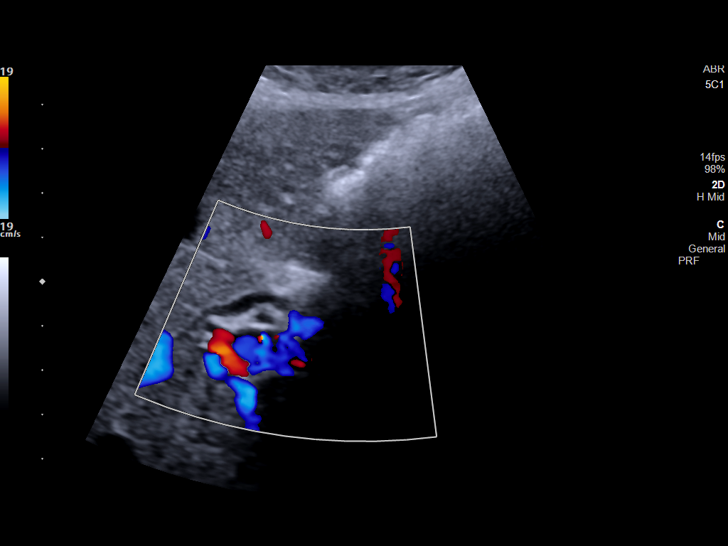
[im 13/74]
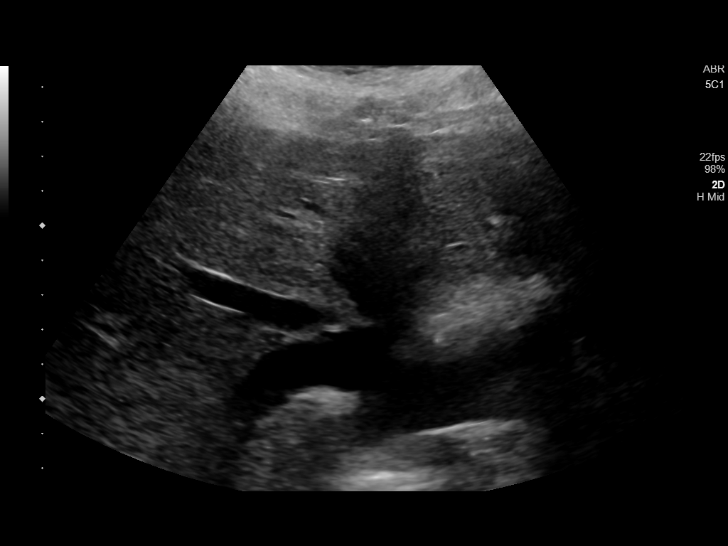
[im 19/74]
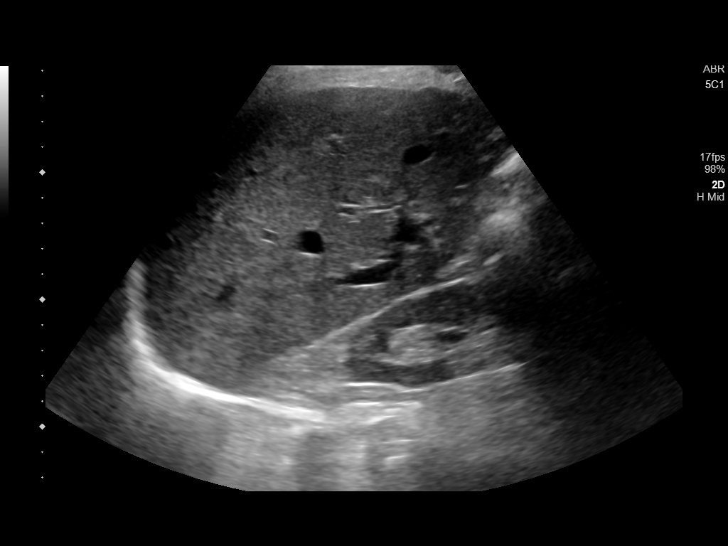
[im 25/74]
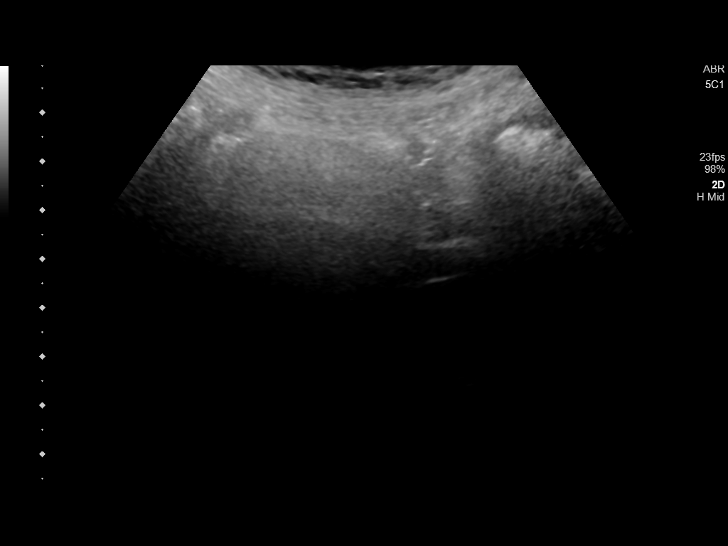
[im 28/74]
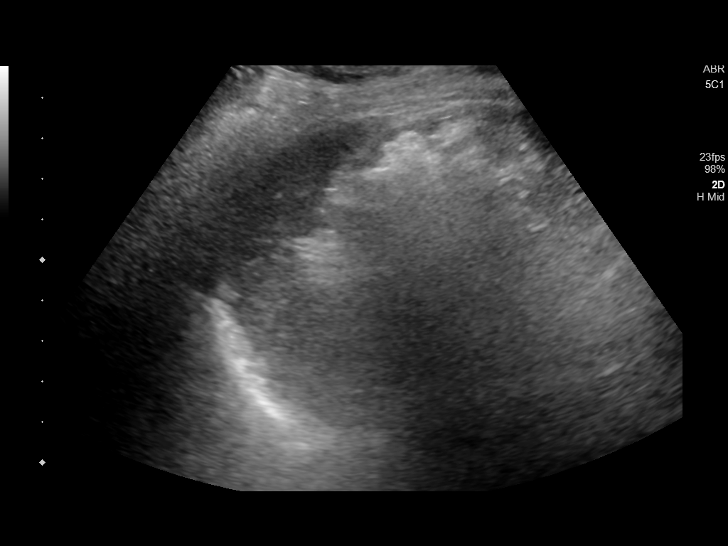
[im 34/74]
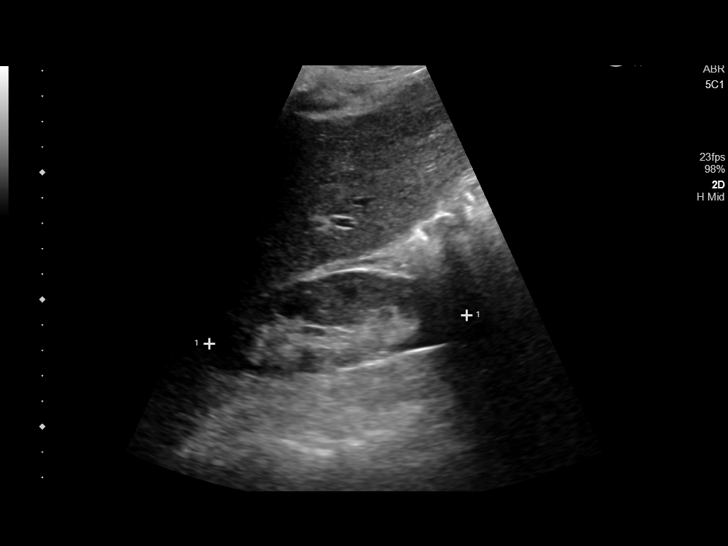
[im 40/74]
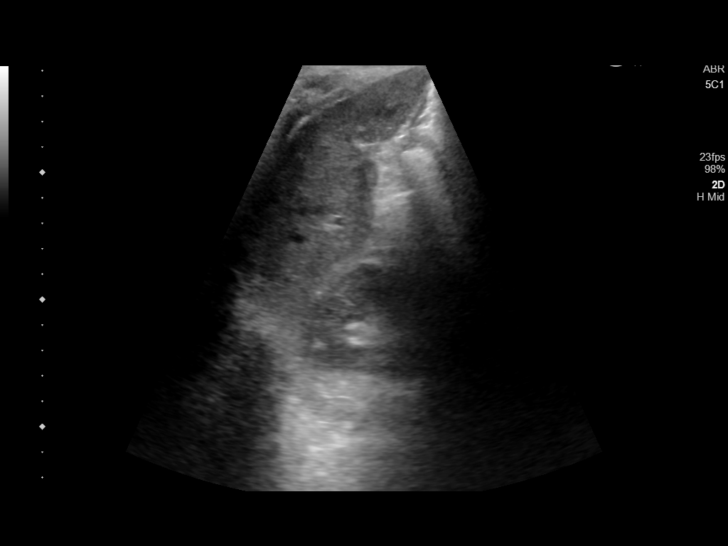
[im 46/74]
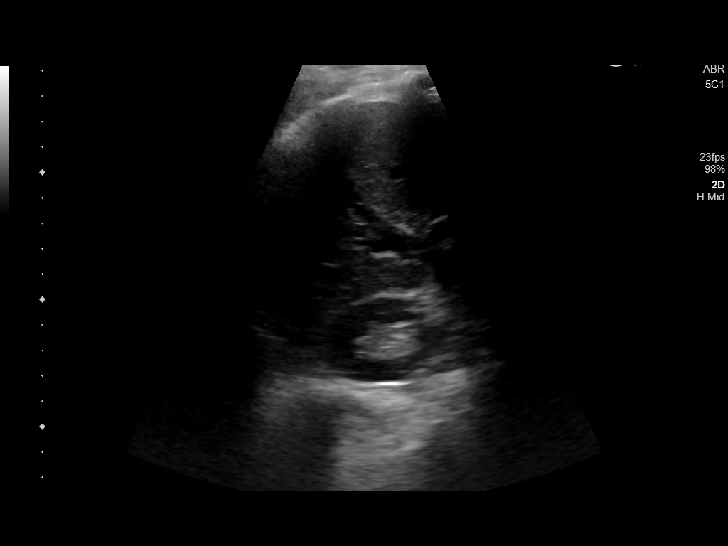
[im 49/74]
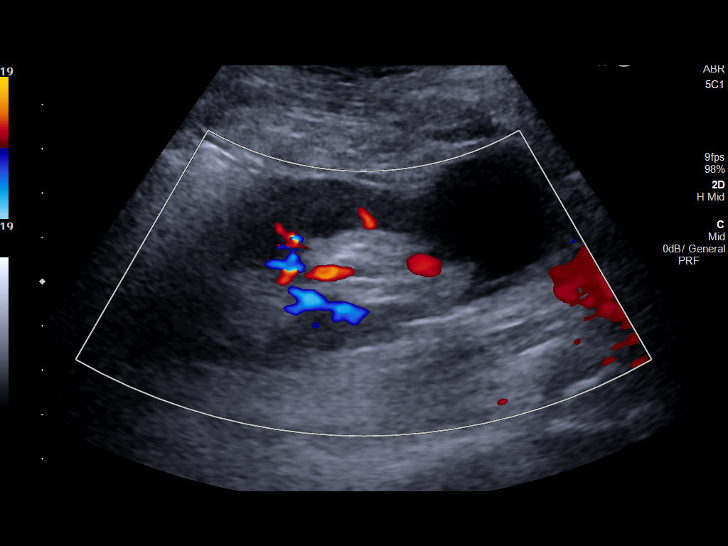
[im 55/74]
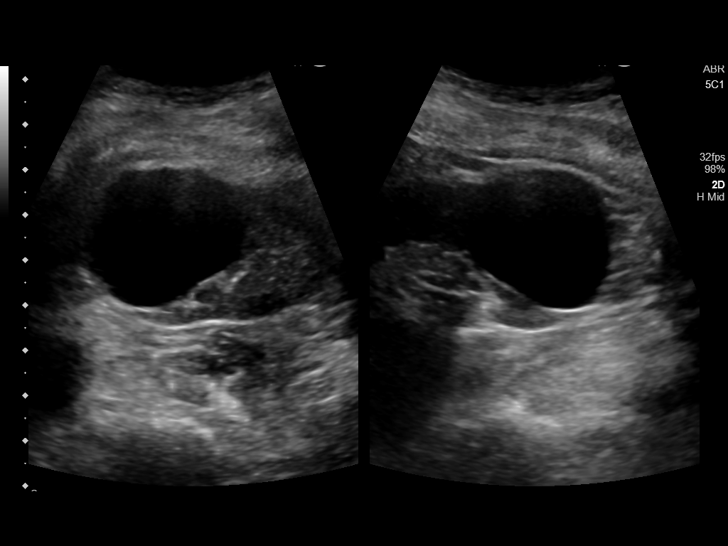
[im 61/74]
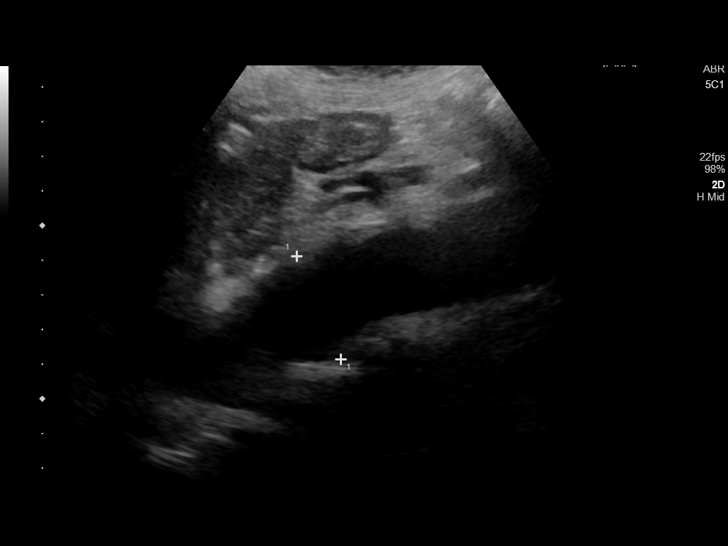
[im 67/74]
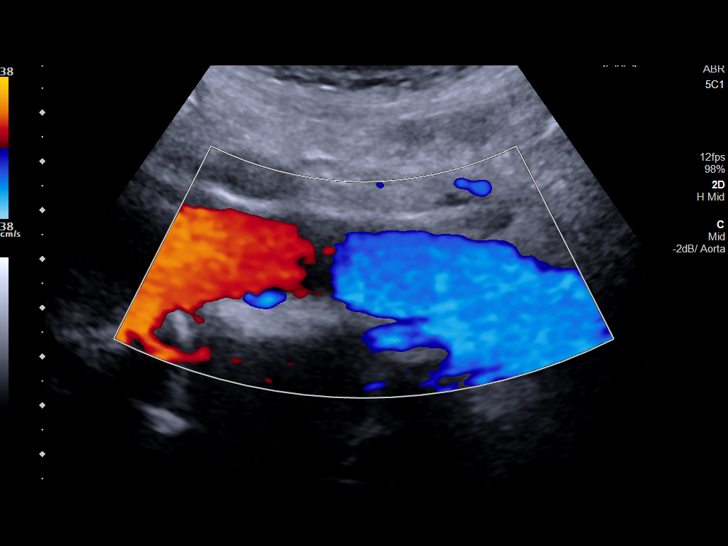
[im 74/74]
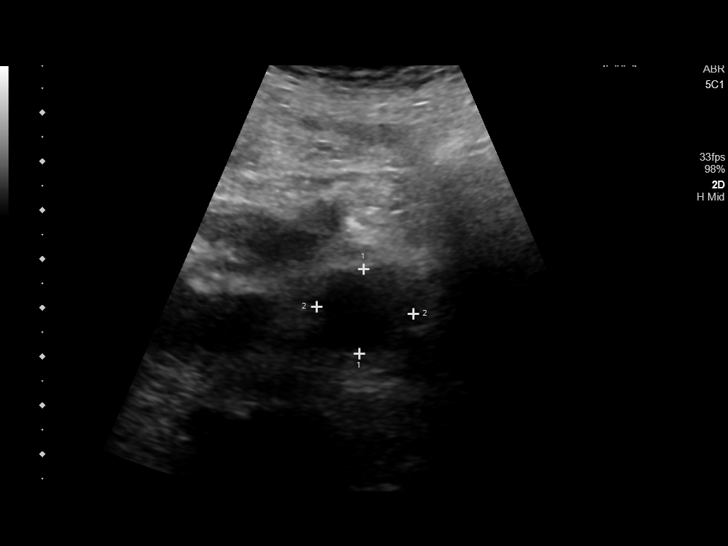

[14 of 25 positions shown; findings below may reference images not displayed]

FINDINGS: Gallbladder: Gallbladder was not visualized.

Common bile duct: Diameter: 3 mm

Liver: No focal lesion identified. Within normal limits in
parenchymal echogenicity. Portal vein is patent on color Doppler
imaging with normal direction of blood flow towards the liver.

IVC: No abnormality visualized.

Pancreas: Visualized portion unremarkable.

Spleen: Size and appearance within normal limits.

Right Kidney: Length: 10.2 cm. Echogenicity within normal limits.
0.9 cm simple cyst. No hydronephrosis visualized.

Left Kidney: Length: 9.0 cm. Echogenicity within normal limits.
cm simple cyst. No hydronephrosis visualized.

Abdominal aorta: No aneurysm visualized.

Other findings: None.
IMPRESSION: 1.  Liver and spleen appear normal.

2.  Gallbladder not visualized.  No biliary distention.

3.  Simple cyst both kidneys.

## 2019-08-12 ENCOUNTER — Inpatient Hospital Stay
Payer: No Typology Code available for payment source | Attending: Hematology and Oncology | Admitting: Hematology and Oncology

## 2019-08-12 DIAGNOSIS — C180 Malignant neoplasm of cecum: Secondary | ICD-10-CM | POA: Insufficient documentation

## 2019-08-21 ENCOUNTER — Other Ambulatory Visit: Payer: Self-pay | Admitting: Adult Health

## 2019-08-26 ENCOUNTER — Other Ambulatory Visit: Payer: Self-pay

## 2019-08-26 ENCOUNTER — Ambulatory Visit (INDEPENDENT_AMBULATORY_CARE_PROVIDER_SITE_OTHER): Payer: Medicare (Managed Care) | Admitting: Gastroenterology

## 2019-08-26 VITALS — BP 97/61 | HR 72 | Temp 98.5°F | Ht 68.0 in

## 2019-08-26 DIAGNOSIS — C189 Malignant neoplasm of colon, unspecified: Secondary | ICD-10-CM

## 2019-08-26 NOTE — Progress Notes (Signed)
Frances Bellows MD, MRCP(U.K) [address]  [place]  [place], [place] [address]  Main: [phone]  Fax: [phone]   Primary Care Physician: Gareth Morgan, MD  Primary Gastroenterologist:  Dr. Jonathon Gardner   Discuss results of biopsies   HPI: Frances Gardner is a 86 y.o. female    Summary of history :  She is here today to discuss results of her recent colonoscopy.  She was admitted on 07/27/2019 with rectal bleeding and anemia.  She underwent a colonoscopy on 08/03/2019 and was found to have a mass in the cecum.  An attempt was made to resected but failed.  Biopsies were taken.  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  She is here today to discuss the results.  Interval history   08/03/2019-08/26/2019  New concerns inform diagnosis of biopsies.  Here with an aide.  Current Outpatient Medications  Medication Sig Dispense Refill  . acetaminophen (TYLENOL) 325 MG tablet Take 650 mg by mouth every 6 (six) hours as needed.    Marland Kitchen acetaminophen (TYLENOL) 650 MG CR tablet Take 650 mg by mouth every 8 (eight) hours as needed for pain.    . carbidopa-levodopa (SINEMET IR) 25-100 MG tablet Take 2 tablets by mouth 3 (three) times daily.    . cholecalciferol (VITAMIN D) 25 MCG (1000 UT) tablet Take 1,000 Units by mouth daily.    Marland Kitchen guaiFENesin-dextromethorphan (ROBITUSSIN DM) 100-10 MG/5ML syrup Take 5 mLs by mouth every 4 (four) hours as needed for cough.    . latanoprost (XALATAN) 0.005 % ophthalmic solution Place 1 drop into both eyes at bedtime.     Marland Kitchen loperamide (IMODIUM) 2 MG capsule Take 2-4 mg by mouth See admin instructions. Take 2 capsules (4mg ) by mouth after first loose stool and then take 1 capsule (2mg ) by mouth after each subsequent loose stool - max 8 capsules in 24 hours    . mineral oil-hydrophilic petrolatum (AQUAPHOR) ointment Apply 1 application topically Nightly.    . Morphine Sulfate (MORPHINE CONCENTRATE) 10 mg / 0.5 ml  concentrated solution Take 4 mg by mouth every 2 (two) hours as needed for severe pain or shortness of breath.    . naloxone (NARCAN) nasal spray 4 mg/0.1 mL Place 1 spray into the nose.    Vladimir Faster Glycol-Propyl Glycol (SYSTANE) 0.4-0.3 % SOLN Place 1 drop into both eyes 2 (two) times daily as needed (dry eyes).    Marland Kitchen senna (SENOKOT) 8.6 MG TABS tablet Take 8.6 mg by mouth 2 (two) times daily as needed for mild constipation.    . Zinc Oxide (DESITIN) 13 % CREA Apply 1 application topically 3 (three) times daily. (after toileting)     No current facility-administered medications for this visit.    Allergies as of 08/26/2019 - Review Complete 08/03/2019  Allergen Reaction Noted  . Penicillins  06/29/2018  . Shellfish-derived products  06/29/2018    ROS:  General: Negative for anorexia, weight loss, fever, chills, fatigue, weakness. ENT: Negative for hoarseness, difficulty swallowing , nasal congestion. CV: Negative for chest pain, angina, palpitations, dyspnea on exertion, peripheral edema.  Respiratory: Negative for dyspnea at rest, dyspnea on exertion, cough, sputum, wheezing.  GI: See history of present illness. GU:  Negative for dysuria, hematuria, urinary incontinence, urinary frequency, nocturnal urination.  Endo: Negative for unusual weight change.    Physical Examination:   There were no vitals taken for this visit.  General: Alert and oriented x3 in a wheelchair.  Eyes: No icterus. Conjunctivae pink. Neuro: Alert and oriented x 3.  Skin: Warm and dry, no jaundice.   Psych: Alert and cooperative, normal mood and affect.   Imaging Studies: DG Abd 1 View  Result Date: 07/27/2019 CLINICAL DATA:  Rectal bleeding. EXAM: ABDOMEN - 1 VIEW COMPARISON:  None. FINDINGS: The bowel gas pattern is normal. Moderate to marked bowel content is identified throughout colon. Degenerative joint changes of the spine are identified. Prior cholecystectomy clips are noted. IMPRESSION: No bowel  obstruction. Moderate to marked bowel content identified throughout colon. This can be seen in constipation. Electronically Signed   By: Abelardo Diesel M.D.   On: 07/27/2019 16:46    Assessment and Plan:   Frances Gardner is a 86 y.o. y/o female was admitted in February 2021 with a GI bleed and rectal bleeding.  Colonoscopy demonstrated a mass in the cecum which could not be resected.  Biopsies of the same demonstrated at least intramucosal adenocarcinoma.  She is here to discuss the results.  Plan 1.  Refer to Dr. Tasia Catchings in oncology to discuss options which would include doing nothing versus imaging and surgery    Dr Frances Bellows  MD,MRCP [facility]) Follow up in as needed

## 2019-08-28 ENCOUNTER — Inpatient Hospital Stay: Payer: Medicare (Managed Care) | Attending: Hematology and Oncology | Admitting: Hematology and Oncology

## 2019-08-28 ENCOUNTER — Telehealth: Payer: Self-pay

## 2019-08-28 ENCOUNTER — Telehealth: Payer: Self-pay | Admitting: Hematology and Oncology

## 2019-08-28 ENCOUNTER — Other Ambulatory Visit: Payer: Self-pay

## 2019-08-28 DIAGNOSIS — Z7189 Other specified counseling: Secondary | ICD-10-CM

## 2019-08-28 DIAGNOSIS — C180 Malignant neoplasm of cecum: Secondary | ICD-10-CM

## 2019-08-28 DIAGNOSIS — D500 Iron deficiency anemia secondary to blood loss (chronic): Secondary | ICD-10-CM | POA: Insufficient documentation

## 2019-08-28 DIAGNOSIS — G3183 Dementia with Lewy bodies: Secondary | ICD-10-CM | POA: Insufficient documentation

## 2019-08-28 DIAGNOSIS — D62 Acute posthemorrhagic anemia: Secondary | ICD-10-CM

## 2019-08-28 DIAGNOSIS — Z993 Dependence on wheelchair: Secondary | ICD-10-CM | POA: Insufficient documentation

## 2019-08-28 DIAGNOSIS — K922 Gastrointestinal hemorrhage, unspecified: Secondary | ICD-10-CM | POA: Insufficient documentation

## 2019-08-28 NOTE — Telephone Encounter (Signed)
08/28/19 Pt arrived for her appt today, was unable to give her information @ check in. The driver also had no knowledge of Pt's personal information, as she was just supposed to transport the pt from the facility to the [facility]. I contacted the nursing home she is a resident at, [facility] and when I spoke to her nurse to get some updated information and see why she was unaccompanied, the nurse informed me she "didn't even know she was gone. I don't know when all her appointments are, that's not my job, [facility] takes care of that. Call them." I was on speaker phone during the call and could hear loud music playing. Pt had to have her appts r/s so that her grandson from [place] could come down and accompany her.  SRW

## 2019-08-28 NOTE — Telephone Encounter (Signed)
Spoke with Fannie Knee to inform him the patient is here for a visit and did'nt have any paperwork from the facility, when the patient was first asked she could not provide her name or DOB. I have informed Fannie Knee that we need her PA her for this visit. I was able to give me the number to the local grandson but didn't get a answer / unable to leave a message. Jamal contacted the office and informed me that he was not able to get in touch with the local grandson and can we get her reschedule and inform him of date . He was inform of time and date for the visit.

## 2019-08-28 NOTE — Telephone Encounter (Signed)
Ranelle Oyster, RN informed of situation for further recommendation.   Attempted to contact DON of facility to inform him of this information. Receptionist states he will not be back in until Monday. VM left requesting callback.

## 2019-09-01 NOTE — Progress Notes (Signed)
This encounter was created in error - please disregard.

## 2019-09-08 NOTE — Progress Notes (Signed)
[facility]  [address], [address] [place], [place] [address] Phone: [phone]  Fax: [phone]   Clinic Day:  09/09/2019  Referring physician: Gareth Morgan, MD  Chief Complaint: Frances Gardner is a 86 y.o. female with adenocarcinoma of the colon who is referred in consultation with Dr. Shelly Coss. Reilly for assessment and management.   HPI:  The patient was admitted to [facility] from 06/29/2018 to 07/03/2018 with a GI bleed with associated anemia. She resides at [facility].  She presented with rectal bleeding. Patient hypotensive with systolic blood pressure in the 70s.  Initial hemoglobin was 7.0.  She was admitted to the ICU on a Protonix drip.  She received 4 units of packed RBC and 1 unit of FFP.   Nuclear medicine bleeding scan on 06/30/2019 revealed a positive GI bleeding scan for active gastrointestinal bleeding at the splenic flexure of the colon.  Interventional angiogram revealed normal select arteriography of the superior and inferior mesenteric arteries.  There was no active bleeding source or vascular abnormality identified to target transcatheter embolization.  EGD was normal by Dr Marius Ditch on 07/03/2018. She did not have a colonoscopy due to poor prep.   She received Feraheme on 07/04/2019.  Abdominal ultrasound on 07/02/2019 revealed a normal liver and spleen, gallbladder not visualized and there was simple cyst in both kidneys. She remained hemodynamically stable with no further bleeding.  Palliative care consult was recommended.  She was discharged back to the skilled nursing facility.  Discharge hemoglobin was 7.9.  The patient was admitted to [facility] from 07/27/2019 to 08/05/2019 with bright red blood per rectum. She had maroon stool.  Hemoglobin was 8.6 with a nadir hemoglobin of 6.7.  She received 1 unit PRBC's on 07/29/2019 and  2 units PRBCs on 08/01/2019.   Flexible sigmoidoscopy on 07/29/2019 by Dr Marius Ditch showed a poor colon prep. There was  blood in the rectum and in the recto-sigmoid colon.   Colonoscopy on 08/03/2019 by Dr Allen [place] revealed a frond-like/villous non-obstructing large mass at the cecum.  Mass was non-circumferential.  Oozing was present. A large portion of the mass was removed, but it could not be removed entirely.  Clips were placed.  When a large part of the polyp was removed, it showed the lesion involved the IC valve and was larger than first appeared.  The lesion in the cecum was not amendable to any further endoscopic intervention.  Pathology revealed at least intramucosal adenocarcinoma arising in a tubulovillous adenoma (invasion into lamina propria). Invasion into the submucosa could not be excluded.  There was traditional serrated adenoma (multiple fragments).      GI felt options included surgery vs palliative care.  Patient's grandson requested a surgery consult.  The patient was seen by Dr Lysle Pearl.  She was felt to be a very poor surgical candidate given her advanced age and comorbidities.  Surgical resection was felt to carry a significant risk for little improvement in quality of care.  Embolization could be attempted if the mass rebled.  Patient was stable for discharge back to [facility] under the care of P[facility] program.  She was to follow up with oncology once pathology results from colon mass biopsy had returned to discuss any potential treatment options.  Discharge hemoglobin was 9.7.  Follow up with her PCP and Dr. Marius Ditch was recommended.   Symptomatically, she feels "ok".  She denies any fever, sweats or weight loss.  She notes bleeding at times but none recently.  She states "you asked too many questions".    In a verbal cognitive testing, the patient stated that she was 86 years old, which is incorrect. She was not able to recall where she was for her appointment or why she was here. She was able to correctly recall her birthday.  She was not able to remember today's day, month, or year. She was able to  recall a past president of the [place], but not the current president. She can not remember the name of her grandson.  The patient has dementia and Parkinson's disease.  She requires assistance with her activities of daily living.  She needs help with dressing and bathing.  She uses a bedpan.  She states that she eats independently and is "sloppy".  She spends all of her days in bed except on Tuesdays.  On Tuesdays, she goes to the activity room.  She notes activities include singing  She wants to discuss with her family about her diagnosis and treatment options.   Past Medical History:  Diagnosis Date  . Dementia ([place])   . Hypothyroidism   . Parkinson's disease [facility])     Past Surgical History:  Procedure Laterality Date  . COLONOSCOPY N/A 08/03/2019   Procedure: COLONOSCOPY;  Surgeon: Jonathon Bellows, MD;  Location: [facility] ENDOSCOPY;  Service: Gastroenterology;  Laterality: N/A;  . COLONOSCOPY WITH PROPOFOL N/A 07/03/2018   Procedure: COLONOSCOPY WITH PROPOFOL;  Surgeon: Lin Landsman, MD;  Location: [facility] ENDOSCOPY;  Service: Gastroenterology;  Laterality: N/A;  . ESOPHAGOGASTRODUODENOSCOPY (EGD) WITH PROPOFOL N/A 07/03/2018   Procedure: ESOPHAGOGASTRODUODENOSCOPY (EGD) WITH PROPOFOL;  Surgeon: Lin Landsman, MD;  Location: [facility] ENDOSCOPY;  Service: Gastroenterology;  Laterality: N/A;  . FLEXIBLE SIGMOIDOSCOPY N/A 07/29/2019   Procedure: FLEXIBLE SIGMOIDOSCOPY;  Surgeon: Lin Landsman, MD;  Location: [facility] ENDOSCOPY;  Service: Gastroenterology;  Laterality: N/A;  . IR ANGIOGRAM VISCERAL SELECTIVE  06/30/2018    History reviewed. No pertinent family history.  Social History:  reports that she has never smoked. She has never used smokeless tobacco. No history on file for alcohol and drug. She is living at the [facility] in [place], [place]; she has lived there for several years. They help her with dressing and bathing.  She uses a bed pan.  She is mostly bed bound,  so for activities she likes to sing. The patient is accompanied by Gabriel Cirri from [facility] today.  Allergies:  Allergies  Allergen Reactions  . Penicillins   . Shellfish-Derived Products     Current Medications: Current Outpatient Medications  Medication Sig Dispense Refill  . acetaminophen (TYLENOL) 650 MG CR tablet Take 650 mg by mouth every 8 (eight) hours as needed for pain.    . carbidopa-levodopa (SINEMET IR) 25-100 MG tablet Take 2 tablets by mouth 3 (three) times daily.    . cholecalciferol (VITAMIN D) 25 MCG (1000 UT) tablet Take 1,000 Units by mouth daily.    . ferrous sulfate 325 (65 FE) MG tablet Take 325 mg by mouth daily with breakfast. Take one tablet once daily on Monday Wednesday and Friday    . latanoprost (XALATAN) 0.005 % ophthalmic solution Place 1 drop into both eyes at bedtime.     . lidocaine (LIDODERM) 5 % Place 1 patch onto the skin daily. Apply a patch to both knees once daily at bed time as needed for pain, remove after 12 hours    . loperamide (IMODIUM) 2 MG capsule Take 2-4 mg by mouth See  admin instructions. Take 2 capsules (4mg ) by mouth after first loose stool and then take 1 capsule (2mg ) by mouth after each subsequent loose stool - max 8 capsules in 24 hours    . mineral oil-hydrophilic petrolatum (AQUAPHOR) ointment Apply 1 application topically Nightly.    . Morphine Sulfate (MORPHINE CONCENTRATE) 10 mg / 0.5 ml concentrated solution Take 4 mg by mouth every 2 (two) hours as needed for severe pain or shortness of breath.    . Nutritional Supplements (ENSURE CLEAR) LIQD Take by mouth.    Vladimir Faster Glycol-Propyl Glycol (SYSTANE) 0.4-0.3 % SOLN Place 1 drop into both eyes 2 (two) times daily as needed (dry eyes).    Marland Kitchen senna (SENOKOT) 8.6 MG TABS tablet Take 8.6 mg by mouth 2 (two) times daily as needed for mild constipation.    . Zinc Oxide (DESITIN) 13 % CREA Apply 1 application topically 3 (three) times daily. (after toileting)    . acetaminophen  (TYLENOL) 325 MG tablet Take 650 mg by mouth every 6 (six) hours as needed.    Marland Kitchen guaiFENesin-dextromethorphan (ROBITUSSIN DM) 100-10 MG/5ML syrup Take 5 mLs by mouth every 4 (four) hours as needed for cough.    . haloperidol (HALDOL) 2 MG/ML solution Take by mouth. .43ml by mouth q4h PRN for agitation, hallucinations anxiety    . LORazepam (ATIVAN) 2 MG/ML concentrated solution Take 0.5 mg by mouth every 8 (eight) hours. May give 0.74ml q2h PO PRN for anxiety and agitation    . naloxone (NARCAN) nasal spray 4 mg/0.1 mL Place 1 spray into the nose.     No current facility-administered medications for this visit.    Review of Systems  Constitutional: Negative for chills, diaphoresis, fever, malaise/fatigue and weight loss.       Feels "ok".  HENT: Negative.  Negative for congestion, ear pain, nosebleeds, sinus pain and sore throat.   Eyes: Positive for blurred vision (occasionally). Negative for double vision and photophobia.  Respiratory: Positive for cough (bubbles). Negative for hemoptysis, sputum production, shortness of breath and wheezing.   Cardiovascular: Negative.  Negative for chest pain, palpitations and leg swelling.  Gastrointestinal: Positive for blood in stool and constipation. Negative for abdominal pain, diarrhea, heartburn, melena, nausea and vomiting.  Genitourinary: Positive for dysuria. Negative for frequency, hematuria and urgency.  Musculoskeletal: Negative.  Negative for back pain, joint pain, myalgias and neck pain.  Skin: Negative.  Negative for itching and rash.  Neurological: Positive for weakness (general). Negative for dizziness, tingling, sensory change, speech change and headaches.       Dementia.  Parkinson's disease.  Endo/Heme/Allergies: Negative.  Does not bruise/bleed easily.  Psychiatric/Behavioral: Positive for memory loss (poor historian). Negative for depression. The patient is not nervous/anxious and does not have insomnia.   All other systems reviewed  and are negative.  Performance status (ECOG):  4  Vitals Blood pressure 139/70, pulse 66, temperature 97.9 F (36.6 C), temperature source Tympanic, resp. rate 18, height 5\' 8"  (1.727 m), SpO2 100 %.   Physical Exam  Constitutional: She appears well-nourished. No distress.  Chronically ill appearing sitting in a wheelchair under a blanket and leaning to the right.  She asks for assistance to straighten up in the seat.  HENT:  Head: Normocephalic and atraumatic.  Mouth/Throat: Oropharynx is clear and moist. No oropharyngeal exudate.  Black wrap.  Missing teeth.  Mask.  Eyes: Pupils are equal, round, and reactive to light. EOM are normal. No scleral icterus.  Prominent brown eyes.  Neck:  No JVD present.  Cardiovascular: Normal rate, regular rhythm and normal heart sounds. Exam reveals no gallop.  No murmur heard. Pulmonary/Chest: Effort normal and breath sounds normal. No respiratory distress. She has no wheezes. She has no rales. She exhibits no tenderness.  Abdominal: Soft. Bowel sounds are normal. She exhibits no distension and no mass. There is no abdominal tenderness. There is no rebound and no guarding.  Musculoskeletal:        General: Edema (2-3+ chronic lower extremity edema) present. No tenderness. Normal range of motion.  Lymphadenopathy:       Head (right side): No preauricular, no posterior auricular and no occipital adenopathy present.       Head (left side): No preauricular, no posterior auricular and no occipital adenopathy present.    She has no cervical adenopathy.    She has no axillary adenopathy.       Right: No inguinal and no supraclavicular adenopathy present.       Left: No inguinal and no supraclavicular adenopathy present.  Neurological: She is alert. She is disoriented (Unaware of place and time).  Skin: Skin is warm and dry. No rash noted. She is not diaphoretic. No erythema. No pallor.  Psychiatric: She has a normal mood and affect. Judgment and thought  content normal. She is agitated. She exhibits abnormal recent memory.  Nursing note and vitals reviewed.   No visits with results within 3 Day(s) from this visit.  Latest known visit with results is:  Admission on 07/27/2019, Discharged on 08/05/2019  Component Date Value Ref Range Status  . WBC 07/27/2019 5.0  4.0 - 10.5 K/uL Final  . RBC 07/27/2019 3.29* 3.87 - 5.11 MIL/uL Final  . Hemoglobin 07/27/2019 8.6* 12.0 - 15.0 g/dL Final  . HCT 07/27/2019 28.7* 36.0 - 46.0 % Final  . MCV 07/27/2019 87.2  80.0 - 100.0 fL Final  . MCH 07/27/2019 26.1  26.0 - 34.0 pg Final  . MCHC 07/27/2019 30.0  30.0 - 36.0 g/dL Final  . RDW 07/27/2019 14.6  11.5 - 15.5 % Final  . Platelets 07/27/2019 159  150 - 400 K/uL Final  . nRBC 07/27/2019 0.0  0.0 - 0.2 % Final  . Neutrophils Relative % 07/27/2019 52  % Final  . Neutro Abs 07/27/2019 2.6  1.7 - 7.7 K/uL Final  . Lymphocytes Relative 07/27/2019 39  % Final  . Lymphs Abs 07/27/2019 2.0  0.7 - 4.0 K/uL Final  . Monocytes Relative 07/27/2019 6  % Final  . Monocytes Absolute 07/27/2019 0.3  0.1 - 1.0 K/uL Final  . Eosinophils Relative 07/27/2019 2  % Final  . Eosinophils Absolute 07/27/2019 0.1  0.0 - 0.5 K/uL Final  . Basophils Relative 07/27/2019 1  % Final  . Basophils Absolute 07/27/2019 0.0  0.0 - 0.1 K/uL Final  . Immature Granulocytes 07/27/2019 0  % Final  . Abs Immature Granulocytes 07/27/2019 0.01  0.00 - 0.07 K/uL Final   Performed at [facility], [address], [place], [place] [address]  . Sodium 07/27/2019 141  135 - 145 mmol/L Final  . Potassium 07/27/2019 4.0  3.5 - 5.1 mmol/L Final  . Chloride 07/27/2019 108  98 - 111 mmol/L Final  . CO2 07/27/2019 27  22 - 32 mmol/L Final  . Glucose, Bld 07/27/2019 108* 70 - 99 mg/dL Final  . BUN 07/27/2019 24* 8 - 23 mg/dL Final  . Creatinine, Ser 07/27/2019 0.53  0.44 - 1.00 mg/dL Final  . Calcium 07/27/2019 8.6*  8.9 - 10.3 mg/dL Final  . Total Protein 07/27/2019 6.1* 6.5 - 8.1 g/dL  Final  . Albumin 07/27/2019 3.1* 3.5 - 5.0 g/dL Final  . AST 07/27/2019 11* 15 - 41 U/L Final  . ALT 07/27/2019 <5  0 - 44 U/L Final  . Alkaline Phosphatase 07/27/2019 44  38 - 126 U/L Final  . Total Bilirubin 07/27/2019 0.4  0.3 - 1.2 mg/dL Final  . GFR calc non Af Amer 07/27/2019 >60  >60 mL/min Final  . GFR calc Af Amer 07/27/2019 >60  >60 mL/min Final  . Anion gap 07/27/2019 6  5 - 15 Final   Performed at [facility], [address], [place], [place] [address]  . Prothrombin Time 07/27/2019 14.7  11.4 - 15.2 seconds Final  . INR 07/27/2019 1.2  0.8 - 1.2 Final   Comment: (NOTE) INR goal varies based on device and disease states. Performed at [facility], [address], [place], [place] [address]   . ABO/RH(D) 07/27/2019 B POS   Final  . Antibody Screen 07/27/2019 NEG   Final  . Sample Expiration 07/27/2019 07/30/2019,2359   Final  . Unit Number 07/27/2019 [id number]   Final  . Blood Component Type 07/27/2019 RED CELLS,LR   Final  . Unit division 07/27/2019 00   Final  . Status of Unit 07/27/2019 ISSUED,FINAL   Final  . Transfusion Status 07/27/2019 OK TO TRANSFUSE   Final  . Crossmatch Result 07/27/2019    Final                   Value:Compatible Performed at [facility], [address] [place], [place] [address]   . SARS Coronavirus 2 by RT PCR 07/27/2019 NEGATIVE  NEGATIVE Final   Comment: (NOTE) SARS-CoV-2 target nucleic acids are NOT DETECTED. The SARS-CoV-2 RNA is generally detectable in upper respiratoy specimens during the acute phase of infection. The lowest concentration of SARS-CoV-2 viral copies this assay can detect is 131 copies/mL. A negative result does not preclude SARS-Cov-2 infection and should not be used as the sole basis for treatment or other patient management decisions. A negative result may occur with  improper specimen collection/handling, submission of specimen other than nasopharyngeal swab, presence of viral  mutation(s) within the areas targeted by this assay, and inadequate number of viral copies (<131 copies/mL). A negative result must be combined with clinical observations, patient history, and epidemiological information. The expected result is Negative. Fact Sheet for Patients:  PinkCheek.be Fact Sheet for Healthcare Providers:  GravelBags.it This test is not yet ap                          proved or cleared by the [place] FDA and  has been authorized for detection and/or diagnosis of SARS-CoV-2 by FDA under an Emergency Use Authorization (EUA). This EUA will remain  in effect (meaning this test can be used) for the duration of the COVID-19 declaration under Section 564(b)(1) of the Act, 21 U.S.C. section 360bbb-3(b)(1), unless the authorization is terminated or revoked sooner.   . Influenza A by PCR 07/27/2019 NEGATIVE  NEGATIVE Final  . Influenza B by PCR 07/27/2019 NEGATIVE  NEGATIVE Final   Comment: (NOTE) The Xpert Xpress SARS-CoV-2/FLU/RSV assay is intended as an aid in  the diagnosis of influenza from Nasopharyngeal swab specimens and  should not be used as a sole basis for treatment. Nasal washings and  aspirates are unacceptable for Xpert Xpress  SARS-CoV-2/FLU/RSV  testing. Fact Sheet for Patients: PinkCheek.be Fact Sheet for Healthcare Providers: GravelBags.it This test is not yet approved or cleared by the [place] FDA and  has been authorized for detection and/or diagnosis of SARS-CoV-2 by  FDA under an Emergency Use Authorization (EUA). This EUA will remain  in effect (meaning this test can be used) for the duration of the  Covid-19 declaration under Section 564(b)(1) of the Act, 21  U.S.C. section 360bbb-3(b)(1), unless the authorization is  terminated or revoked. Performed at [facility], [address], [place], [place]  [address]   . WBC 07/28/2019 4.4  4.0 - 10.5 K/uL Final  . RBC 07/28/2019 3.40* 3.87 - 5.11 MIL/uL Final  . Hemoglobin 07/28/2019 8.8* 12.0 - 15.0 g/dL Final  . HCT 07/28/2019 29.9* 36.0 - 46.0 % Final  . MCV 07/28/2019 87.9  80.0 - 100.0 fL Final  . MCH 07/28/2019 25.9* 26.0 - 34.0 pg Final  . MCHC 07/28/2019 29.4* 30.0 - 36.0 g/dL Final  . RDW 07/28/2019 14.4  11.5 - 15.5 % Final  . Platelets 07/28/2019 161  150 - 400 K/uL Final  . nRBC 07/28/2019 0.0  0.0 - 0.2 % Final   Performed at [facility], [address], [place], [place] [address]  . Hemoglobin 07/27/2019 8.2* 12.0 - 15.0 g/dL Final  . HCT 07/27/2019 27.2* 36.0 - 46.0 % Final   Performed at [facility], [address] [place], [place] [address]  . Hemoglobin 07/28/2019 8.0* 12.0 - 15.0 g/dL Final  . HCT 07/28/2019 26.8* 36.0 - 46.0 % Final   Performed at [facility], [address] [place], [place] [address]  . MRSA by PCR 07/27/2019 NEGATIVE  NEGATIVE Final   Comment:        The GeneXpert MRSA Assay (FDA approved for NASAL specimens only), is one component of a comprehensive MRSA colonization surveillance program. It is not intended to diagnose MRSA infection nor to guide or monitor treatment for MRSA infections. Performed at [facility], [address], [place], [place] [address]   . Ferritin 07/28/2019 27  11 - 307 ng/mL Final   Performed at [facility], [address] [place], [place] [address]  . Vitamin B-12 07/28/2019 494  180 - 914 pg/mL Final   Comment: (NOTE) This assay is not validated for testing neonatal or myeloproliferative syndrome specimens for Vitamin B12 levels. Performed at [facility], [place] [address], [place], [place] [address]   . Folate 07/28/2019 7.7  >5.9 ng/mL Final   Performed at [facility], [address] [place], [place] [address]  . Hemoglobin 07/29/2019 6.7* 12.0 - 15.0 g/dL Final  . HCT 07/29/2019 22.8* 36.0  - 46.0 % Final   Performed at [facility], [address] [place], [place] [address]  . Order Confirmation 07/29/2019    Final                   Value:ORDER PROCESSED BY BLOOD BANK Performed at [facility], [address] [place], [place] [address]   . ISSUE DATE / TIME 07/27/2019 [id number]   Final  . Blood Product Unit Number 07/27/2019 [id number]   Final  . PRODUCT CODE 07/27/2019 [id number]   Final  . Unit Type and Rh 07/27/2019 1700   Final  . Blood Product Expiration Date 07/27/2019 [id number]   Final  . Hemoglobin 07/29/2019 7.9* 12.0 - 15.0 g/dL Final  . HCT 07/29/2019 25.5*  36.0 - 46.0 % Final   Performed at [facility], [address] [place], [place] [address]  . Glucose-Capillary 07/30/2019 85  70 - 99 mg/dL Final  . WBC 07/30/2019 7.5  4.0 - 10.5 K/uL Final  . RBC 07/30/2019 2.74* 3.87 - 5.11 MIL/uL Final  . Hemoglobin 07/30/2019 7.5* 12.0 - 15.0 g/dL Final  . HCT 07/30/2019 24.1* 36.0 - 46.0 % Final  . MCV 07/30/2019 88.0  80.0 - 100.0 fL Final  . MCH 07/30/2019 27.4  26.0 - 34.0 pg Final  . MCHC 07/30/2019 31.1  30.0 - 36.0 g/dL Final  . RDW 07/30/2019 14.9  11.5 - 15.5 % Final  . Platelets 07/30/2019 135* 150 - 400 K/uL Final  . nRBC 07/30/2019 0.0  0.0 - 0.2 % Final   Performed at [facility], [address] [place], [place] [address]  . WBC 07/31/2019 7.0  4.0 - 10.5 K/uL Final  . RBC 07/31/2019 3.11* 3.87 - 5.11 MIL/uL Final  . Hemoglobin 07/31/2019 8.4* 12.0 - 15.0 g/dL Final  . HCT 07/31/2019 27.1* 36.0 - 46.0 % Final  . MCV 07/31/2019 87.1  80.0 - 100.0 fL Final  . MCH 07/31/2019 27.0  26.0 - 34.0 pg Final  . MCHC 07/31/2019 31.0  30.0 - 36.0 g/dL Final  . RDW 07/31/2019 14.9  11.5 - 15.5 % Final  . Platelets 07/31/2019 94* 150 - 400 K/uL Final   Comment: Immature Platelet Fraction may be clinically indicated, consider ordering this additional test [id number]   . nRBC 07/31/2019 0.0  0.0 - 0.2 % Final    Performed at [facility], [address] [place], [place] [address]  . Sodium 08/01/2019 143  135 - 145 mmol/L Final  . Potassium 08/01/2019 3.2* 3.5 - 5.1 mmol/L Final  . Chloride 08/01/2019 111  98 - 111 mmol/L Final  . CO2 08/01/2019 27  22 - 32 mmol/L Final  . Glucose, Bld 08/01/2019 88  70 - 99 mg/dL Final  . BUN 08/01/2019 8  8 - 23 mg/dL Final  . Creatinine, Ser 08/01/2019 0.49  0.44 - 1.00 mg/dL Final  . Calcium 08/01/2019 8.5* 8.9 - 10.3 mg/dL Final  . GFR calc non Af Amer 08/01/2019 >60  >60 mL/min Final  . GFR calc Af Amer 08/01/2019 >60  >60 mL/min Final  . Anion gap 08/01/2019 5  5 - 15 Final   Performed at [facility], [address] [place],  [address]  . Hemoglobin 08/01/2019 6.7* 12.0 - 15.0 g/dL Final  . HCT 08/01/2019 22.1* 36.0 - 46.0 % Final   Performed at [facility], [address] [place],  [address]  . ABO/RH(D) 08/01/2019 B POS   Final  . Antibody Screen 08/01/2019 NEG   Final  . Sample Expiration 08/01/2019 08/04/2019,2359   Final  . Unit Number 08/01/2019 [id number]   Final  . Blood Component Type 08/01/2019 RED CELLS,LR   Final  . Unit division 08/01/2019 00   Final  . Status of Unit 08/01/2019 ISSUED,FINAL   Final  . Transfusion Status 08/01/2019 OK TO TRANSFUSE   Final  . Crossmatch Result 08/01/2019    Final                   Value:Compatible Performed at [facility], [address] [place],  [address]   . Unit Number 08/01/2019 [id number]   Final  . Blood Component Type 08/01/2019 RED CELLS,LR   Final  .  Unit division 08/01/2019 00   Final  . Status of Unit 08/01/2019 ISSUED,FINAL   Final  . Transfusion Status 08/01/2019 OK TO TRANSFUSE   Final  . Crossmatch Result 08/01/2019 Compatible   Final  . Order Confirmation 08/01/2019    Final                   Value:ORDER PROCESSED BY BLOOD BANK Performed at [facility], [address], [place], [place] [address]   .  ISSUE DATE / TIME 08/01/2019 [id number]   Final  . Blood Product Unit Number 08/01/2019 [id number]   Final  . PRODUCT CODE 08/01/2019 [id number]   Final  . Unit Type and Rh 08/01/2019 7300   Final  . Blood Product Expiration Date 08/01/2019 [id number]   Final  . ISSUE DATE / TIME 08/01/2019 [id number]   Final  . Blood Product Unit Number 08/01/2019 [id number]   Final  . PRODUCT CODE 08/01/2019 [id number]   Final  . Unit Type and Rh 08/01/2019 7300   Final  . Blood Product Expiration Date 08/01/2019 [id number]   Final  . Potassium 08/02/2019 3.5  3.5 - 5.1 mmol/L Final   Performed at [facility], [address] [place], [place] [address]  . WBC 08/02/2019 6.1  4.0 - 10.5 K/uL Final  . RBC 08/02/2019 3.61* 3.87 - 5.11 MIL/uL Final  . Hemoglobin 08/02/2019 10.1* 12.0 - 15.0 g/dL Final   REPEATED TO VERIFY  . HCT 08/02/2019 32.5* 36.0 - 46.0 % Final  . MCV 08/02/2019 90.0  80.0 - 100.0 fL Final  . MCH 08/02/2019 28.0  26.0 - 34.0 pg Final  . MCHC 08/02/2019 31.1  30.0 - 36.0 g/dL Final  . RDW 08/02/2019 15.7* 11.5 - 15.5 % Final  . Platelets 08/02/2019 150  150 - 400 K/uL Final  . nRBC 08/02/2019 0.3* 0.0 - 0.2 % Final   Performed at [facility], [address] [place], [place] [address]  . Potassium 08/03/2019 2.9* 3.5 - 5.1 mmol/L Final   Performed at [facility], [address] [place], [place] [address]  . WBC 08/03/2019 6.5  4.0 - 10.5 K/uL Final  . RBC 08/03/2019 3.53* 3.87 - 5.11 MIL/uL Final  . Hemoglobin 08/03/2019 9.8* 12.0 - 15.0 g/dL Final  . HCT 08/03/2019 31.6* 36.0 - 46.0 % Final  . MCV 08/03/2019 89.5  80.0 - 100.0 fL Final  . MCH 08/03/2019 27.8  26.0 - 34.0 pg Final  . MCHC 08/03/2019 31.0  30.0 - 36.0 g/dL Final  . RDW 08/03/2019 15.6* 11.5 - 15.5 % Final  . Platelets 08/03/2019 160  150 - 400 K/uL Final  . nRBC 08/03/2019 0.3* 0.0 - 0.2 % Final   Performed at [facility], [address], [place], [place]  [address]  . Magnesium 08/03/2019 1.9  1.7 - 2.4 mg/dL Final   Performed at [facility], [address] [place], [place] [address]  . SURGICAL PATHOLOGY 08/03/2019    Final-Edited                   Value:SURGICAL PATHOLOGY CASE: [id number] PATIENT: [facility] Surgical Pathology Report  Specimen Submitted: A. Colon polyps x2, cecum; hot snare  Clinical History: Hematochezia  DIAGNOSIS: A.  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 - TRADITIONAL SERRATED ADENOMA (MULTIPLE FRAGMENTS). - DEEPER SECTIONS EXAMINED.  Comment: Sections demonstrate multiple fragments of tubulovillous adenoma and  traditional serrated adenoma. Within the tubulovillous adenoma at least intramucosal adenocarcinoma is present (invasion into the lamina propria); invasion into the submucosa cannot be entirely excluded. Deeper sections were examined. Preliminary findings and final results were communicated to Dr. Vicente Males via [facility] secure text on 08/04/2019 and 08/05/2019 respectively.  GROSS DESCRIPTION: A. Labeled: Hot snare cecal polyp x2 Received: In formalin Tissue fragment(s): Multiple Size: Ag                         gregate, 2.0 x 2.0 x 0.7 cm Description: Red soft villous tissue fragments Entirely submitted in 4 cassettes.  Final Diagnosis performed by Quay Burow, MD.   Electronically signed 08/05/2019 9:35:47AM The electronic signature indicates that the named Attending Pathologist has evaluated the specimen Technical component performed at [facility], [address] [place], [place] [address] Lab: [phone] Dir: Rush Farmer, MD, MMM  Professional component performed at [facility], [facility], [place], [place], [place] [address] Lab: [phone] Dir: Dellia Nims. Rubinas, MD  . Potassium 08/04/2019 3.0* 3.5 - 5.1 mmol/L Final   Performed at [facility], [address]  [place], [place] [address]  . Hemoglobin 08/04/2019 10.3* 12.0 - 15.0 g/dL Final  . HCT 08/04/2019 33.3* 36.0 - 46.0 % Final   Performed at [facility], [address] [place], [place] [address]  . Potassium 08/04/2019 3.9  3.5 - 5.1 mmol/L Final   Performed at [facility], [address] [place], [place] [address]  . Hemoglobin 08/05/2019 9.7* 12.0 - 15.0 g/dL Final  . HCT 08/05/2019 31.5* 36.0 - 46.0 % Final   Performed at [facility], [address] [place], [place] [address]    Assessment:  Frances Gardner is a 86 y.o. 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  Invasion into the submucosa could not be excluded.  Colonoscopy on 08/03/2019 revealed a frond-like/villous non-obstructing large mass at the cecum.  Mass was non-circumferential.  Oozing was present. A large portion of the mass was removed, but it could not be removed entirely.  Clips were placed.  When a large part of the polyp was removed, it showed the lesion involved the IC valve and was larger than first appeared.  The lesion in the cecum was not amendable to any further endoscopic intervention.  She was admitted to [facility] from 06/29/2018 to 07/03/2018 with rectal bleeding. She received 4 units of PRBCs and 1 unit of FFP.  Nuclear medicine bleeding scan on 06/30/2019 revealed a positive GI bleeding scan for active gastrointestinal bleeding at the splenic flexure of the colon.  Interventional angiogram revealed normal select arteriography of the superior and inferior mesenteric arteries.  There was no active bleeding source or vascular abnormality identified to target transcatheter embolization.  She was admitted to [facility] from 07/27/2019 to 08/05/2019 with bright red blood per rectum.  She received 1 unit PRBCs on 07/29/2019 and  2 units PRBCs on 08/01/2019.   EGD was normal on 07/03/2018. She received  Feraheme on 07/04/2019 and 07/28/2019.  Ferritin has been followed:  45 on 07/02/2018 and 27 on 07/28/2019.  B12 and folate were normal on 07/28/2019.  She lives at [facility].  She has dementia and Parkinson's disease.   Symptomatically, she feels "ok".  She denies any recurrent bleeding.   Performance status is 4.  Plan: 1.   Review recent labs from [facility].  2.   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  Review patient's colonoscopy and diagnosis of colon cancer.  Extent of disease is unknown; lesion has been responsible for GI bleeding.  Lesion is not amendable to endoscopic resection.  Discuss typically, patient would undergo colon resection to remove lesion.  Discuss Dr Ines Bloomer note from her hospitalization.  Agree that she is a poor surgical candidate given her performance status, age, and co-morbidies.  Patient is unsure about what she would like to do.  Discuss having a family conference regarding direction of therapy. 3.   Anemia secondary to GI bleeding  Discuss supportive care and close monitoring for any recurrent GI bleeding.  Consider transfusion support and IV iron as needed.  Consider interventional radiology procedure if significant blood loss. 4.   Coordinate next visit when grandsons are available [place]). 5.   RTC in next 2-4 weeks for MD assessment, labs (CBC with diff, ferritin, iron studies, hold tube), and discussion regarding direction of therapy.     I discussed the assessment and treatment plan with the patient.  The patient was provided an opportunity to ask questions and all were answered.  The patient agreed with the plan and demonstrated an understanding of the instructions.  The patient was advised to call back if the symptoms worsen or if the condition fails to improve as anticipated.  I provided 32 minutes of face-to-face time during  this this encounter and > 50% was spent counseling as documented under my assessment and plan. An additional 25 minutes were spent reviewing her chart (Epic and Care Everywhere) including notes, labs, and imaging studies.    Melissa C. Mike Gip, MD, PhD    09/09/2019, 3:42 PM  I, Jacqualyn Posey, am acting as a Education administrator for [employer]. Mike Gip, MD.   I, Melissa C. Mike Gip, MD, have reviewed the above documentation for accuracy and completeness, and I agree with the above.

## 2019-09-09 ENCOUNTER — Encounter: Payer: Self-pay | Admitting: Hematology and Oncology

## 2019-09-09 ENCOUNTER — Inpatient Hospital Stay (HOSPITAL_BASED_OUTPATIENT_CLINIC_OR_DEPARTMENT_OTHER): Payer: Medicare (Managed Care) | Admitting: Hematology and Oncology

## 2019-09-09 ENCOUNTER — Other Ambulatory Visit: Payer: Self-pay

## 2019-09-09 VITALS — BP 139/70 | HR 66 | Temp 97.9°F | Resp 18 | Ht 68.0 in

## 2019-09-09 DIAGNOSIS — R1310 Dysphagia, unspecified: Secondary | ICD-10-CM | POA: Insufficient documentation

## 2019-09-09 DIAGNOSIS — D500 Iron deficiency anemia secondary to blood loss (chronic): Secondary | ICD-10-CM | POA: Diagnosis not present

## 2019-09-09 DIAGNOSIS — F329 Major depressive disorder, single episode, unspecified: Secondary | ICD-10-CM | POA: Insufficient documentation

## 2019-09-09 DIAGNOSIS — U071 COVID-19: Secondary | ICD-10-CM | POA: Insufficient documentation

## 2019-09-09 DIAGNOSIS — G3183 Dementia with Lewy bodies: Secondary | ICD-10-CM | POA: Diagnosis not present

## 2019-09-09 DIAGNOSIS — R609 Edema, unspecified: Secondary | ICD-10-CM | POA: Insufficient documentation

## 2019-09-09 DIAGNOSIS — K219 Gastro-esophageal reflux disease without esophagitis: Secondary | ICD-10-CM | POA: Insufficient documentation

## 2019-09-09 DIAGNOSIS — H40053 Ocular hypertension, bilateral: Secondary | ICD-10-CM | POA: Insufficient documentation

## 2019-09-09 DIAGNOSIS — E559 Vitamin D deficiency, unspecified: Secondary | ICD-10-CM | POA: Insufficient documentation

## 2019-09-09 DIAGNOSIS — E079 Disorder of thyroid, unspecified: Secondary | ICD-10-CM | POA: Insufficient documentation

## 2019-09-09 DIAGNOSIS — R634 Abnormal weight loss: Secondary | ICD-10-CM | POA: Insufficient documentation

## 2019-09-09 DIAGNOSIS — K029 Dental caries, unspecified: Secondary | ICD-10-CM | POA: Insufficient documentation

## 2019-09-09 DIAGNOSIS — I129 Hypertensive chronic kidney disease with stage 1 through stage 4 chronic kidney disease, or unspecified chronic kidney disease: Secondary | ICD-10-CM | POA: Insufficient documentation

## 2019-09-09 DIAGNOSIS — N3281 Overactive bladder: Secondary | ICD-10-CM | POA: Insufficient documentation

## 2019-09-09 DIAGNOSIS — K922 Gastrointestinal hemorrhage, unspecified: Secondary | ICD-10-CM

## 2019-09-09 DIAGNOSIS — F32A Depression, unspecified: Secondary | ICD-10-CM | POA: Insufficient documentation

## 2019-09-09 DIAGNOSIS — Z7189 Other specified counseling: Secondary | ICD-10-CM

## 2019-09-09 DIAGNOSIS — H0520 Unspecified exophthalmos: Secondary | ICD-10-CM | POA: Insufficient documentation

## 2019-09-09 DIAGNOSIS — Z993 Dependence on wheelchair: Secondary | ICD-10-CM

## 2019-09-09 DIAGNOSIS — R532 Functional quadriplegia: Secondary | ICD-10-CM | POA: Insufficient documentation

## 2019-09-09 DIAGNOSIS — D62 Acute posthemorrhagic anemia: Secondary | ICD-10-CM

## 2019-09-09 DIAGNOSIS — I10 Essential (primary) hypertension: Secondary | ICD-10-CM | POA: Insufficient documentation

## 2019-09-09 DIAGNOSIS — M2450 Contracture, unspecified joint: Secondary | ICD-10-CM | POA: Insufficient documentation

## 2019-09-09 DIAGNOSIS — R32 Unspecified urinary incontinence: Secondary | ICD-10-CM | POA: Insufficient documentation

## 2019-09-09 DIAGNOSIS — C180 Malignant neoplasm of cecum: Secondary | ICD-10-CM

## 2019-09-09 NOTE — Assessment & Plan Note (Signed)
Episode

## 2020-08-31 IMAGING — CR DG ABDOMEN 1V
1 series · 3 of 3 positions shown · non-contrast
Comparison: None.

CLINICAL DATA: Rectal bleeding.

EXAM:
ABDOMEN - 1 VIEW

[Series 1: dg abd 1 view · 0.14mm/px · 3 of 3 slices shown]
[im 1/3]
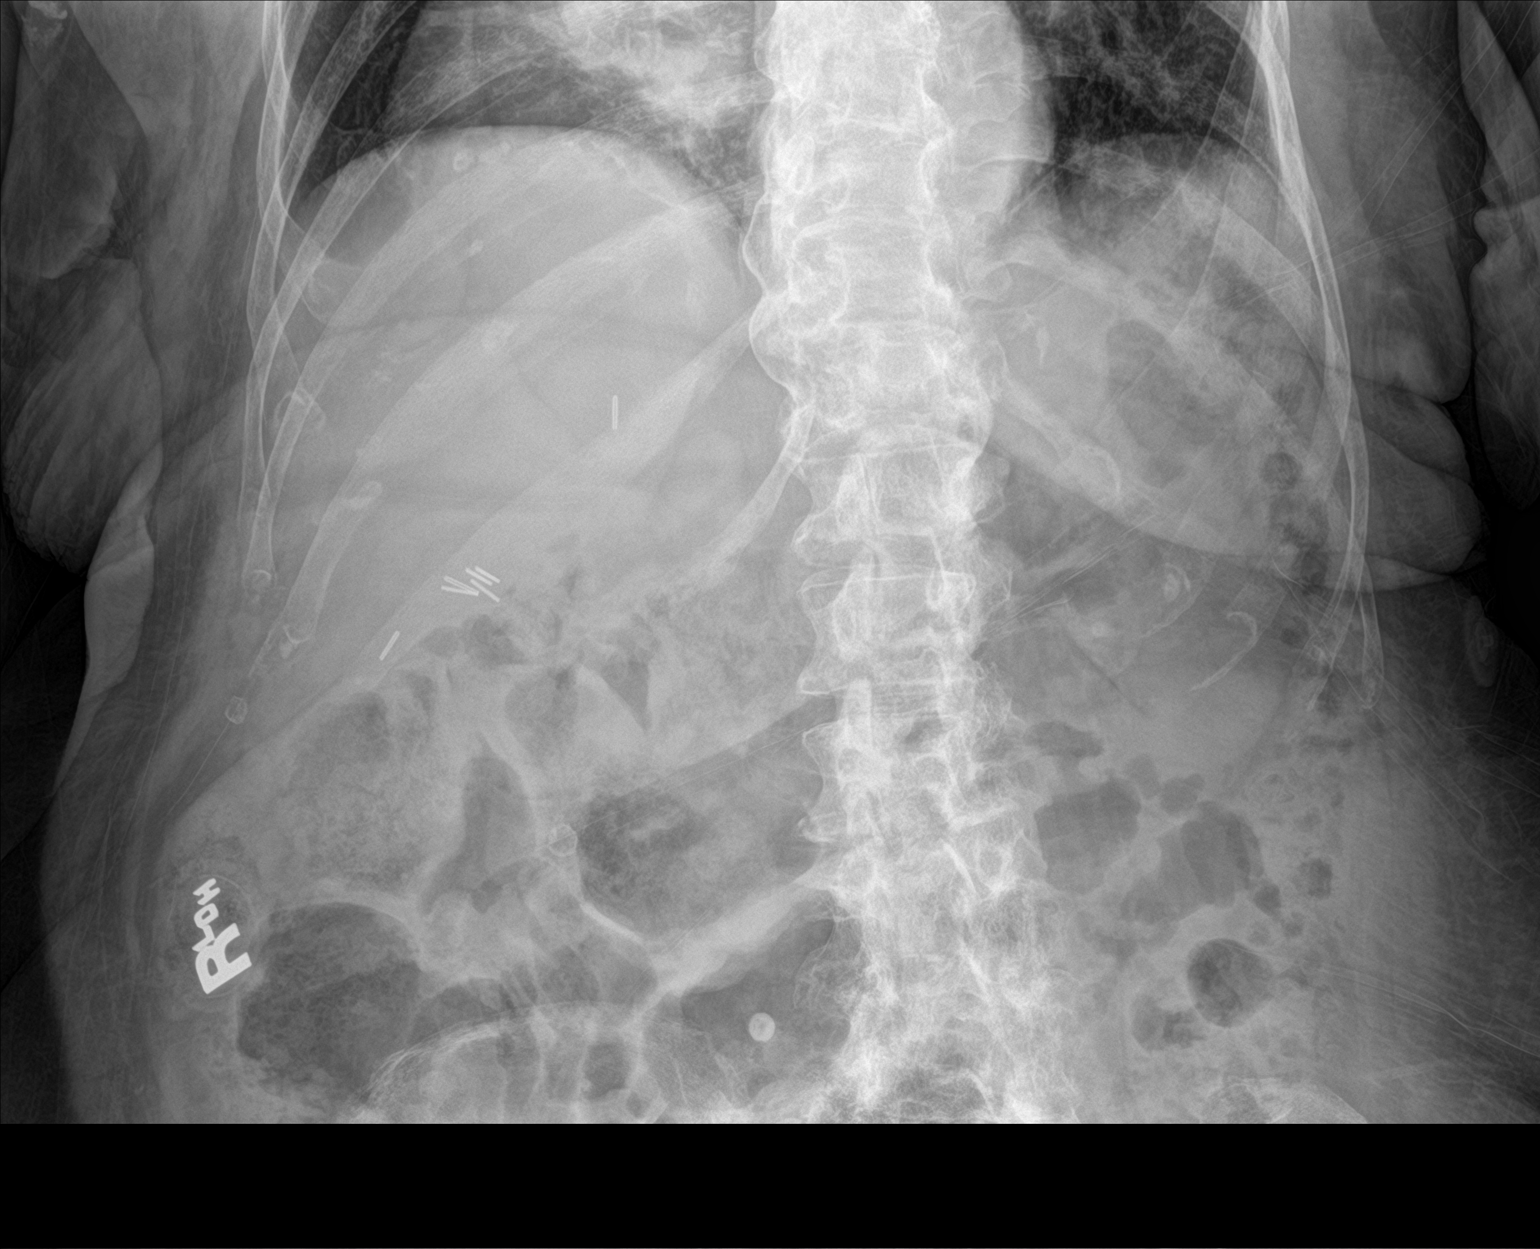
[im 2/3]
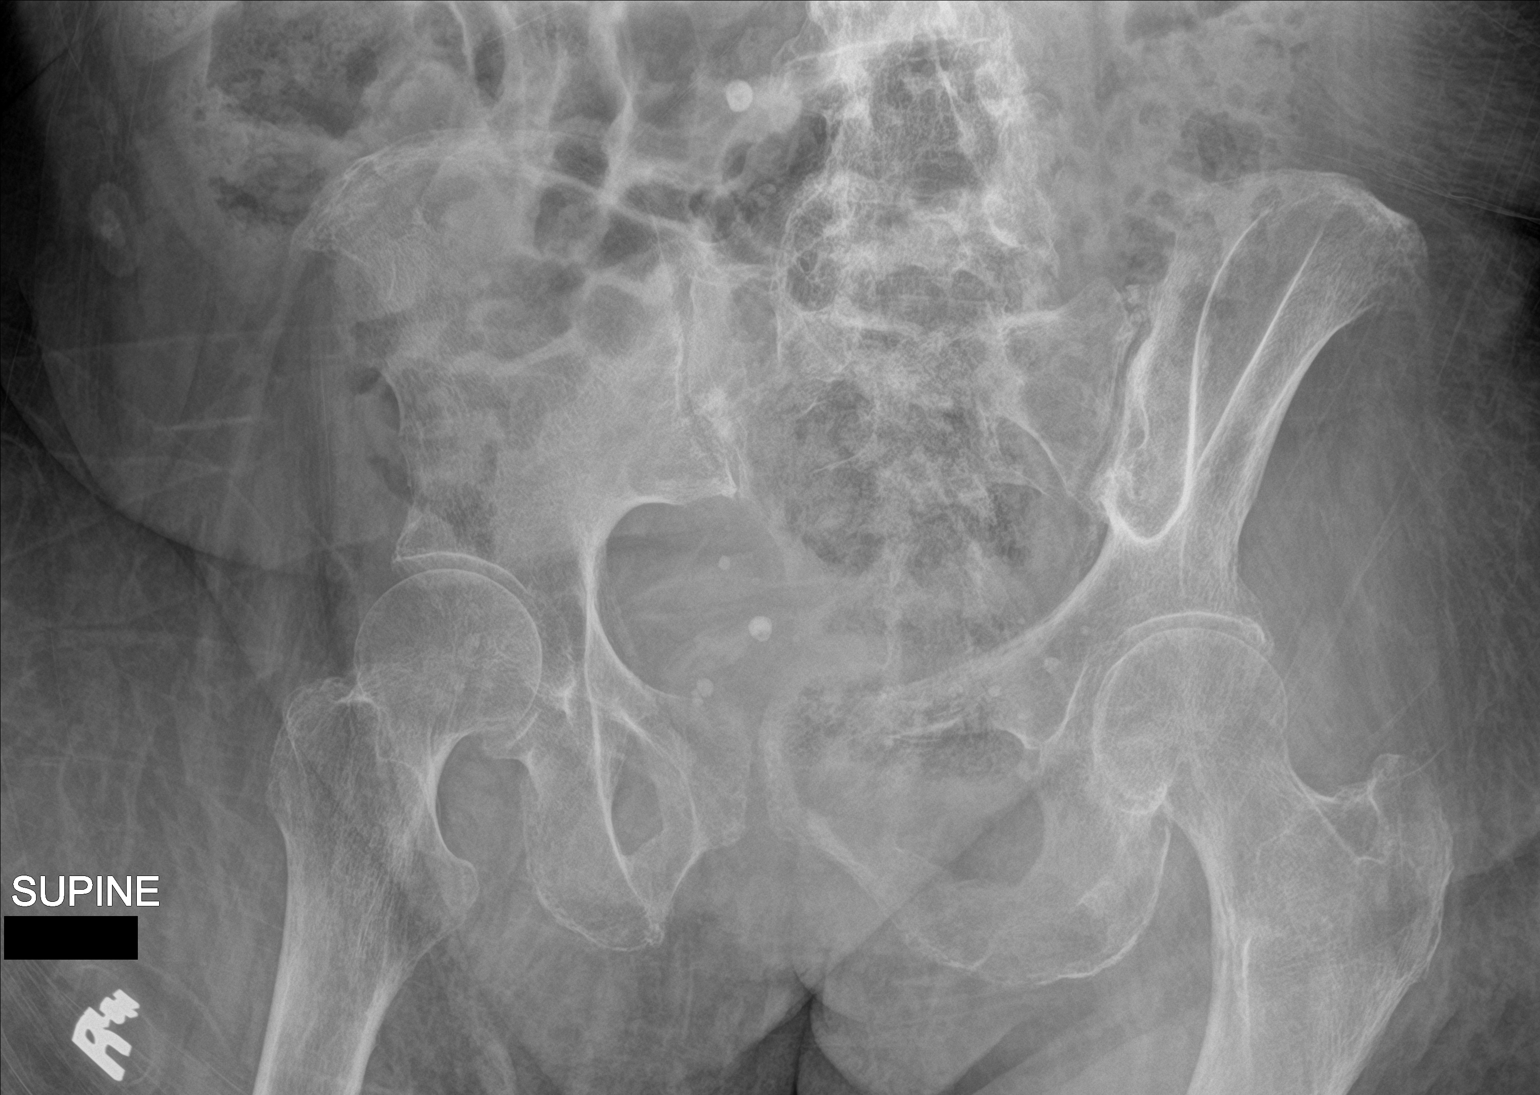
[im 3/3]
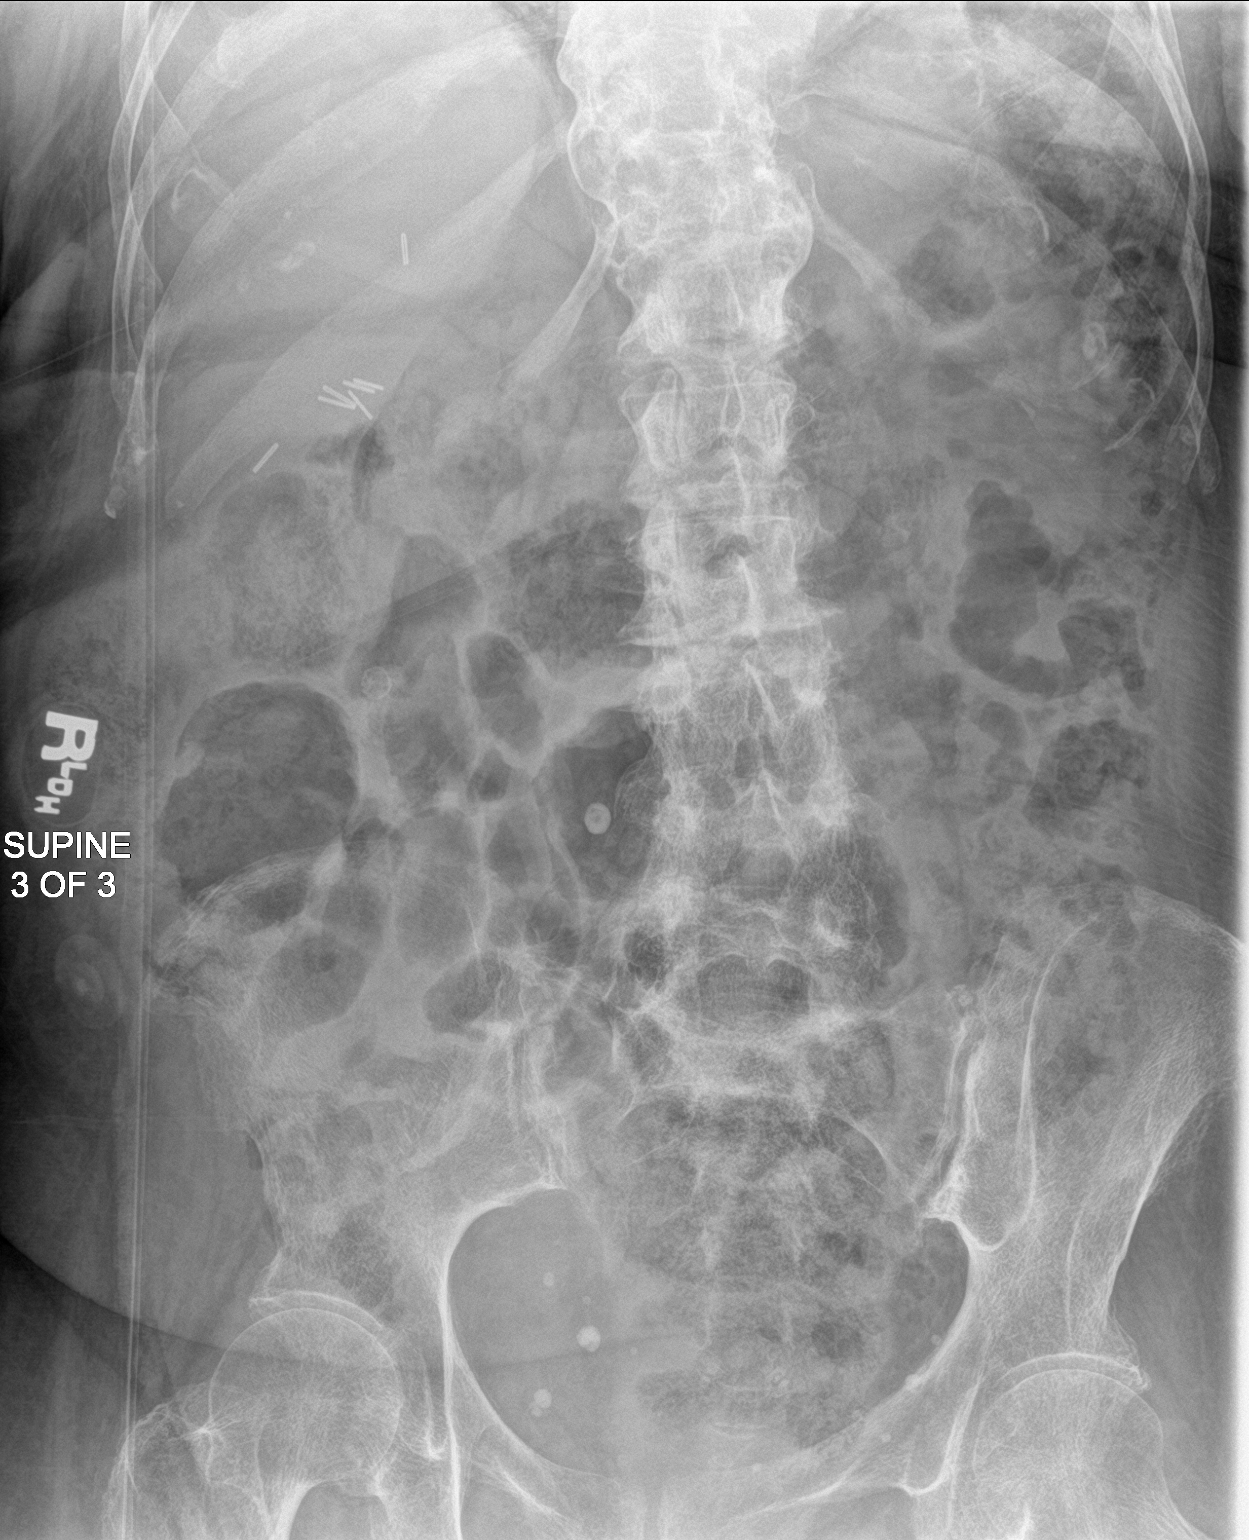

[3 of 3 positions shown; findings below may reference images not displayed]

FINDINGS: The bowel gas pattern is normal. Moderate to marked bowel content is
identified throughout colon. Degenerative joint changes of the spine
are identified. Prior cholecystectomy clips are noted.
IMPRESSION: No bowel obstruction. Moderate to marked bowel content identified
throughout colon. This can be seen in constipation.

## 2021-06-25 DEATH — deceased
